# Patient Record
Sex: Female | Born: 1940 | Race: Black or African American | Hispanic: No | Smoking: Former smoker
Health system: Southern US, Community
[De-identification: ages and names within clinical notes are randomized; demographics above are authoritative.]

## PROBLEM LIST (undated history)

## (undated) DIAGNOSIS — J45909 Unspecified asthma, uncomplicated: Secondary | ICD-10-CM

## (undated) DIAGNOSIS — I639 Cerebral infarction, unspecified: Secondary | ICD-10-CM

## (undated) DIAGNOSIS — N281 Cyst of kidney, acquired: Secondary | ICD-10-CM

## (undated) DIAGNOSIS — M1990 Unspecified osteoarthritis, unspecified site: Secondary | ICD-10-CM

## (undated) DIAGNOSIS — L509 Urticaria, unspecified: Secondary | ICD-10-CM

## (undated) DIAGNOSIS — I10 Essential (primary) hypertension: Secondary | ICD-10-CM

## (undated) DIAGNOSIS — K219 Gastro-esophageal reflux disease without esophagitis: Secondary | ICD-10-CM

## (undated) DIAGNOSIS — H409 Unspecified glaucoma: Secondary | ICD-10-CM

## (undated) DIAGNOSIS — G4730 Sleep apnea, unspecified: Secondary | ICD-10-CM

## (undated) DIAGNOSIS — R569 Unspecified convulsions: Secondary | ICD-10-CM

## (undated) DIAGNOSIS — R310 Gross hematuria: Secondary | ICD-10-CM

## (undated) DIAGNOSIS — N183 Chronic kidney disease, stage 3 (moderate): Secondary | ICD-10-CM

## (undated) DIAGNOSIS — E785 Hyperlipidemia, unspecified: Secondary | ICD-10-CM

## (undated) HISTORY — PX: HAND SURGERY: SHX662

## (undated) HISTORY — DX: Essential (primary) hypertension: I10

## (undated) HISTORY — DX: Cyst of kidney, acquired: N28.1

## (undated) HISTORY — DX: Unspecified osteoarthritis, unspecified site: M19.90

## (undated) HISTORY — DX: Unspecified glaucoma: H40.9

## (undated) HISTORY — DX: Urticaria, unspecified: L50.9

## (undated) HISTORY — PX: PARTIAL HYSTERECTOMY: SHX80

## (undated) HISTORY — DX: Chronic kidney disease, stage 3 (moderate): N18.3

## (undated) HISTORY — DX: Cerebral infarction, unspecified: I63.9

## (undated) HISTORY — DX: Sleep apnea, unspecified: G47.30

## (undated) HISTORY — DX: Unspecified convulsions: R56.9

## (undated) HISTORY — DX: Gastro-esophageal reflux disease without esophagitis: K21.9

## (undated) HISTORY — DX: Gross hematuria: R31.0

## (undated) HISTORY — PX: ABDOMINAL HYSTERECTOMY: SHX81

## (undated) HISTORY — DX: Hyperlipidemia, unspecified: E78.5

---

## 2014-04-11 ENCOUNTER — Other Ambulatory Visit (HOSPITAL_COMMUNITY): Payer: Self-pay | Admitting: Family Medicine

## 2014-04-11 DIAGNOSIS — Z1231 Encounter for screening mammogram for malignant neoplasm of breast: Secondary | ICD-10-CM

## 2014-04-30 ENCOUNTER — Ambulatory Visit (HOSPITAL_COMMUNITY)
Admission: RE | Admit: 2014-04-30 | Discharge: 2014-04-30 | Disposition: A | Discharge status: Home / Self Care | Payer: Medicare HMO | Source: Ambulatory Visit | Attending: Family Medicine | Admitting: Family Medicine

## 2014-04-30 DIAGNOSIS — Z1231 Encounter for screening mammogram for malignant neoplasm of breast: Secondary | ICD-10-CM | POA: Diagnosis present

## 2014-12-14 ENCOUNTER — Other Ambulatory Visit: Payer: Self-pay | Admitting: Nephrology

## 2014-12-14 DIAGNOSIS — N183 Chronic kidney disease, stage 3 unspecified: Secondary | ICD-10-CM

## 2014-12-20 ENCOUNTER — Ambulatory Visit
Admission: RE | Admit: 2014-12-20 | Discharge: 2014-12-20 | Disposition: A | Discharge status: Home / Self Care | Payer: Medicare HMO | Source: Ambulatory Visit | Attending: Nephrology | Admitting: Nephrology

## 2014-12-20 DIAGNOSIS — N183 Chronic kidney disease, stage 3 unspecified: Secondary | ICD-10-CM

## 2014-12-20 DIAGNOSIS — N281 Cyst of kidney, acquired: Secondary | ICD-10-CM | POA: Diagnosis not present

## 2015-02-06 ENCOUNTER — Other Ambulatory Visit (HOSPITAL_COMMUNITY): Payer: Self-pay | Admitting: Respiratory Therapy

## 2015-02-06 DIAGNOSIS — G4733 Obstructive sleep apnea (adult) (pediatric): Secondary | ICD-10-CM

## 2015-02-15 ENCOUNTER — Other Ambulatory Visit (HOSPITAL_COMMUNITY): Payer: Self-pay | Admitting: Respiratory Therapy

## 2015-02-26 ENCOUNTER — Other Ambulatory Visit (HOSPITAL_COMMUNITY): Payer: Self-pay | Admitting: Nephrology

## 2015-02-26 DIAGNOSIS — IMO0002 Reserved for concepts with insufficient information to code with codable children: Secondary | ICD-10-CM

## 2015-02-26 DIAGNOSIS — R229 Localized swelling, mass and lump, unspecified: Principal | ICD-10-CM

## 2015-02-27 ENCOUNTER — Ambulatory Visit (HOSPITAL_COMMUNITY): Payer: Medicare HMO

## 2015-03-11 ENCOUNTER — Ambulatory Visit (HOSPITAL_COMMUNITY)
Admission: RE | Admit: 2015-03-11 | Discharge: 2015-03-11 | Disposition: A | Discharge status: Home / Self Care | Payer: Medicare HMO | Source: Ambulatory Visit | Attending: Nephrology | Admitting: Nephrology

## 2015-03-11 DIAGNOSIS — R319 Hematuria, unspecified: Secondary | ICD-10-CM | POA: Insufficient documentation

## 2015-03-11 DIAGNOSIS — R229 Localized swelling, mass and lump, unspecified: Secondary | ICD-10-CM

## 2015-03-11 DIAGNOSIS — K8020 Calculus of gallbladder without cholecystitis without obstruction: Secondary | ICD-10-CM | POA: Diagnosis not present

## 2015-03-11 DIAGNOSIS — K5730 Diverticulosis of large intestine without perforation or abscess without bleeding: Secondary | ICD-10-CM | POA: Diagnosis not present

## 2015-03-11 DIAGNOSIS — IMO0002 Reserved for concepts with insufficient information to code with codable children: Secondary | ICD-10-CM

## 2015-03-11 DIAGNOSIS — N2889 Other specified disorders of kidney and ureter: Secondary | ICD-10-CM | POA: Insufficient documentation

## 2015-04-09 ENCOUNTER — Ambulatory Visit: Payer: Medicare HMO | Attending: Neurology | Admitting: Sleep Medicine

## 2015-04-09 ENCOUNTER — Emergency Department (HOSPITAL_COMMUNITY): Admission: EM | Admit: 2015-04-09 | Discharge: 2015-04-09 | Discharge status: Absent without leave | Payer: Medicare HMO

## 2015-04-09 DIAGNOSIS — G4733 Obstructive sleep apnea (adult) (pediatric): Secondary | ICD-10-CM | POA: Insufficient documentation

## 2015-04-26 NOTE — Sleep Study (Signed)
  [place] A. Merlene Laughter, MD     [email or web address]             NOCTURNAL POLYSOMNOGRAPHY   LOCATION: [facility]  Patient Name: Diana Kent, Diana Kent Date: 04/09/2015 Gender: Female D.O.B: 06-Sep-1940 Age (years): 51 Referring Provider: Phillips Odor MD, ABSM Height (inches): 68 Interpreting Physician: Phillips Odor MD, ABSM Weight (lbs): 186 RPSGT: Rosebud Poles BMI: 28 MRN: [id number] Neck Size: 15.50 CLINICAL INFORMATION The patient is referred for a split night study with BPAP. MEDICATIONS Prior to Admission medications   Not on File    SLEEP STUDY TECHNIQUE As per the AASM Manual for the Scoring of Sleep and Associated Events v2.3 (April 2016) with a hypopnea requiring 4% desaturations. The channels recorded and monitored were frontal, central and occipital EEG, electrooculogram (EOG), submentalis EMG (chin), nasal and oral airflow, thoracic and abdominal wall motion, anterior tibialis EMG, snore microphone, electrocardiogram, and pulse oximetry. Bi-level positive airway pressure (BiPAP) was initiated when the patient met split night criteria and was titrated according to treat sleep-disordered breathing. RESPIRATORY PARAMETERS Diagnostic Total AHI (/hr): 61.7 RDI (/hr): 61.7 OA Index (/hr): 33.2 CA Index (/hr): 0.5 REM AHI (/hr): 47.4 NREM AHI (/hr): 64.6 Supine AHI (/hr): 58.5 Non-supine AHI (/hr): 92.19 Min O2 Sat (%): 86.00 Mean O2 (%): 93.38 Time below 88% (min): 4.6   Titration Optimal IPAP Pressure (cm): 11 Optimal EPAP Pressure (cm):  AHI at Optimal Pressure (/hr): 2.8 Min O2 at Optimal Pressure (%): 90.0 Sleep % at Optimal (%): 91 Supine % at Optimal (%): 100     SLEEP ARCHITECTURE The study was initiated at 10:24:58 PM and terminated at 4:49:10 AM. The total recorded time was 384.2 minutes. EEG confirmed total sleep time was 280.5 minutes yielding a sleep efficiency of 73.0%. Sleep onset after lights out was 27.6 minutes with a REM latency of  99.5 minutes. The patient spent 6.95% of the night in stage N1 sleep, 63.99% in stage N2 sleep, 4.28% in stage N3 and 24.78% in REM. Wake after sleep onset (WASO) was 76.1 minutes. The Arousal Index was 26.7/hour. LEG MOVEMENT DATA The total Periodic Limb Movements of Sleep (PLMS) were 138. The PLMS index was 29.52 . CARDIAC DATA The 2 lead EKG demonstrated atrial fibrillation. The mean heart rate was 49.52 beats per minute. Other EKG findings include: PVCs.   IMPRESSIONS - Severe obstructive sleep apnea occurred during the diagnostic portion of the study (AHI = 61.7 /hour). An optimal PAP pressure was selected for this patient ( 11 / cm of water).   Diana Metz, MD Diplomate, American Board of Sleep Medicine.

## 2015-05-24 ENCOUNTER — Ambulatory Visit: Payer: Self-pay | Admitting: Urology

## 2015-06-04 ENCOUNTER — Ambulatory Visit (INDEPENDENT_AMBULATORY_CARE_PROVIDER_SITE_OTHER): Payer: Medicare HMO | Admitting: Urology

## 2015-06-04 ENCOUNTER — Encounter: Payer: Self-pay | Admitting: Urology

## 2015-06-04 VITALS — BP 136/75 | HR 49 | Ht 66.5 in | Wt 186.5 lb

## 2015-06-04 DIAGNOSIS — N281 Cyst of kidney, acquired: Secondary | ICD-10-CM

## 2015-06-04 DIAGNOSIS — N183 Chronic kidney disease, stage 3 unspecified: Secondary | ICD-10-CM

## 2015-06-04 DIAGNOSIS — R310 Gross hematuria: Secondary | ICD-10-CM

## 2015-06-04 DIAGNOSIS — R3129 Other microscopic hematuria: Secondary | ICD-10-CM

## 2015-06-04 HISTORY — DX: Cyst of kidney, acquired: N28.1

## 2015-06-04 HISTORY — DX: Gross hematuria: R31.0

## 2015-06-04 HISTORY — DX: Chronic kidney disease, stage 3 unspecified: N18.30

## 2015-06-04 NOTE — Progress Notes (Signed)
06/04/2015 3:47 PM   Diana Kent 10-07-1940 [id number]  Referring provider: Chip Boer, PA-C [address] [place], [place] [address]  Chief Complaint  Patient presents with  . New Patient (Initial Visit)    hematuria, renal mass    HPI:  1 - Gross Hematuria - Pt with few episodes tea colored urine c/w gross hematuria and RBC on UA x several. Remote 20PY smoker.   2 - Bilateral Renal Cysts - Right lower posterior 2.1cm, Rt anterior 1cm, Left Lower 1.7cm cysts by [place] then non-con MRI on eval CKD. No prior contrast imaging. No solid masses.  3 - Stage 3 Renal Insufficiency - Cr 1.3's / GFR 40s by BMP x several. Established with Dr. Juleen China with [facility]. [place] and MRI 2016 w/o hydro.  PMH sig for CKD, very mild dementia ( no limitations ), HTN. NO CV disease / blood thinners. Her PCP is with [facility].   Today " Diana Kent " is seen as new patient for above.    PMH: Past Medical History  Diagnosis Date  . Acid reflux   . Arthritis   . Glaucoma   . Hyperlipidemia   . Hypertension   . Stroke ([place])   . Sleep apnea   . Seizure [facility])     Surgical History: Past Surgical History  Procedure Laterality Date  . Hand surgery    . Partial hysterectomy      Home Medications:    Medication List       This list is accurate as of: 06/04/15  3:47 PM.  Always use your most recent med list.               ALPHAGAN P 0.1 % Soln  Generic drug:  brimonidine     dipyridamole-aspirin 200-25 MG 12hr capsule  Commonly known as:  AGGRENOX     donepezil 10 MG tablet  Commonly known as:  ARICEPT     lamoTRIgine 100 MG tablet  Commonly known as:  LAMICTAL     losartan 50 MG tablet  Commonly known as:  COZAAR     lovastatin 40 MG tablet  Commonly known as:  MEVACOR     metoprolol 100 MG tablet  Commonly known as:  LOPRESSOR     verapamil 180 MG CR tablet  Commonly known as:  CALAN-SR        Allergies:  Allergies  Allergen Reactions  .  Latex   . Penicillins     Family History: Family History  Problem Relation Age of Onset  . Prostate cancer Neg Hx   . Kidney cancer Neg Hx     Social History:  has no tobacco, alcohol, and drug history on file.    Review of Systems  Gastrointestinal (upper)  : Negative for upper GI symptoms  Gastrointestinal (lower) : Negative for lower GI symptoms  Constitutional : Negative for symptoms  Skin: Negative for skin symptoms  Eyes: Negative for eye symptoms  Ear/Nose/Throat : Negative for Ear/Nose/Throat symptoms  Hematologic/Lymphatic: Negative for Hematologic/Lymphatic symptoms  Cardiovascular : Negative for cardiovascular symptoms  Respiratory : Negative for respiratory symptoms  Endocrine: Negative for endocrine symptoms  Musculoskeletal: Negative for musculoskeletal symptoms  Neurological: Negative for neurological symptoms  Psychologic: Negative for psychiatric symptoms     Physical Exam: Ht 5' 6.5" (1.689 m)  Wt 84.596 kg (186 lb 8 oz)  BMI 29.65 kg/m2  Constitutional:  Alert and oriented, No acute distress. HEENT: [place] AT, moist mucus membranes.  Trachea midline, no masses. Cardiovascular: No clubbing, cyanosis, or edema. Respiratory: Normal respiratory effort, no increased work of breathing. GI: Abdomen is soft, nontender, nondistended, no abdominal masses GU: No CVA tenderness.  Skin: No rashes, bruises or suspicious lesions. Lymph: No cervical or inguinal adenopathy. Neurologic: Grossly intact, no focal deficits, moving all 4 extremities. Psychiatric: Normal mood and affect.  Laboratory Data: No results found for: WBC, HGB, HCT, MCV, PLT  No results found for: CREATININE  No results found for: PSA  No results found for: TESTOSTERONE  No results found for: HGBA1C  Urinalysis No results found for: COLORURINE, APPEARANCEUR, LABSPEC, PHURINE, GLUCOSEU, HGBUR, BILIRUBINUR, KETONESUR, PROTEINUR, UROBILINOGEN, NITRITE,  LEUKOCYTESUR   Assessment & Plan:  1 - Gross Hematuria - rec complete eval with CT and cysto. Her GFR should be acceptable for this.   2 - Bilateral Renal Cysts - likely non-complex cysts. NO mass effect. CT as per above to maximally r/o enhancement. .  3 - Stage 3 Renal Insufficiency - likely medical renal disease as no hydro on renal imaging x several. Agree with strict BP and glucose management.   4 - RTC 3 weeks or so for Ct and cysto.    No Follow-up on file.  Alexis Frock, [facility] [address] [place], [place] [address] [phone]

## 2015-06-05 LAB — MICROSCOPIC EXAMINATION

## 2015-06-05 LAB — URINALYSIS, COMPLETE
Bilirubin, UA: NEGATIVE
Glucose, UA: NEGATIVE
Ketones, UA: NEGATIVE
Leukocytes, UA: NEGATIVE
NITRITE UA: NEGATIVE
PH UA: 7 (ref 5.0–7.5)
Protein, UA: NEGATIVE
Specific Gravity, UA: 1.025 (ref 1.005–1.030)
Urobilinogen, Ur: 0.2 mg/dL (ref 0.2–1.0)

## 2015-06-25 ENCOUNTER — Other Ambulatory Visit: Payer: Medicare HMO

## 2015-06-27 ENCOUNTER — Ambulatory Visit
Admission: RE | Admit: 2015-06-27 | Discharge: 2015-06-27 | Disposition: A | Discharge status: Home / Self Care | Payer: Medicare HMO | Source: Ambulatory Visit | Attending: Urology | Admitting: Urology

## 2015-06-27 DIAGNOSIS — K8020 Calculus of gallbladder without cholecystitis without obstruction: Secondary | ICD-10-CM | POA: Insufficient documentation

## 2015-06-27 DIAGNOSIS — K5730 Diverticulosis of large intestine without perforation or abscess without bleeding: Secondary | ICD-10-CM | POA: Insufficient documentation

## 2015-06-27 DIAGNOSIS — N281 Cyst of kidney, acquired: Secondary | ICD-10-CM | POA: Diagnosis present

## 2015-06-27 DIAGNOSIS — R310 Gross hematuria: Secondary | ICD-10-CM | POA: Diagnosis not present

## 2015-06-27 DIAGNOSIS — N289 Disorder of kidney and ureter, unspecified: Secondary | ICD-10-CM | POA: Insufficient documentation

## 2015-06-27 HISTORY — DX: Unspecified asthma, uncomplicated: J45.909

## 2015-06-27 LAB — POCT I-STAT CREATININE: CREATININE: 1.4 mg/dL — AB (ref 0.44–1.00)

## 2015-06-27 MED ORDER — IOHEXOL 300 MG/ML  SOLN
100.0000 mL | Freq: Once | INTRAMUSCULAR | Status: AC | PRN
  Administered 2015-06-27: 100 mL via INTRAVENOUS

## 2015-07-08 ENCOUNTER — Ambulatory Visit (INDEPENDENT_AMBULATORY_CARE_PROVIDER_SITE_OTHER): Payer: Medicare HMO | Admitting: Urology

## 2015-07-08 ENCOUNTER — Encounter: Payer: Self-pay | Admitting: Urology

## 2015-07-08 VITALS — BP 170/96 | HR 58 | Ht 66.0 in | Wt 192.7 lb

## 2015-07-08 DIAGNOSIS — N281 Cyst of kidney, acquired: Secondary | ICD-10-CM | POA: Diagnosis not present

## 2015-07-08 DIAGNOSIS — R310 Gross hematuria: Secondary | ICD-10-CM | POA: Diagnosis not present

## 2015-07-08 DIAGNOSIS — N183 Chronic kidney disease, stage 3 unspecified: Secondary | ICD-10-CM

## 2015-07-08 LAB — URINALYSIS, COMPLETE
BILIRUBIN UA: NEGATIVE
GLUCOSE, UA: NEGATIVE
Ketones, UA: NEGATIVE
Nitrite, UA: NEGATIVE
Specific Gravity, UA: 1.015 (ref 1.005–1.030)
UUROB: 0.2 mg/dL (ref 0.2–1.0)
pH, UA: 7 (ref 5.0–7.5)

## 2015-07-08 LAB — MICROSCOPIC EXAMINATION

## 2015-07-08 MED ORDER — CIPROFLOXACIN HCL 500 MG PO TABS
500.0000 mg | ORAL_TABLET | Freq: Once | ORAL | Status: AC
  Administered 2015-07-08: 500 mg via ORAL

## 2015-07-08 MED ORDER — LIDOCAINE HCL 2 % EX GEL
1.0000 "application " | Freq: Once | CUTANEOUS | Status: AC
  Administered 2015-07-08: 1 via URETHRAL

## 2015-07-08 NOTE — Progress Notes (Signed)
11:28 AM   Diana Kent September 01, 1940 [id number]  Referring provider: Chip Boer, PA-C [address] [place], [place] [address]  Chief Complaint  Patient presents with  . Cysto    HPI:  1 - Gross Hematuria - Pt with few episodes tea colored urine c/w gross hematuria and RBC on UA x several. Remote 20PY smoker. CT Urogram 06/2015 w/o worisome lesions.   2 - Bilateral Renal Cysts - Right lower posterior 2.1cm, Rt anterior 1cm, Left Lower 1.7cm cysts by [place] then non-con MRI on eval CKD. Contrast CT 06/2015 w/o enhancement or interval growth,  No solid masses.  3 - Stage 3 Renal Insufficiency - Cr 1.3's / GFR 40s by BMP x several. Established with Dr. Juleen China with [facility]. [place] and MRI 2016 w/o hydro.  PMH sig for CKD, very mild dementia ( no limitations ), HTN. NO CV disease / blood thinners. Her PCP is with [facility].   Today " Diana Kent " is seen for f./u above and cysto to complete hematuria eval.  No interval hematuria.    PMH: Past Medical History  Diagnosis Date  . Acid reflux   . Arthritis   . Glaucoma   . Hyperlipidemia   . Hypertension   . Stroke ([place])   . Sleep apnea   . Seizure ([place])   . Asthma   . CKD (chronic kidney disease) stage 3, GFR 30-59 ml/min 06/04/2015  . Gross hematuria 06/04/2015  . Renal cysts, acquired, bilateral 06/04/2015    Surgical History: Past Surgical History  Procedure Laterality Date  . Hand surgery    . Partial hysterectomy      Home Medications:    Medication List       This list is accurate as of: 07/08/15 11:28 AM.  Always use your most recent med list.               ALPHAGAN P 0.1 % Soln  Generic drug:  brimonidine     dipyridamole-aspirin 200-25 MG 12hr capsule  Commonly known as:  AGGRENOX     donepezil 10 MG tablet  Commonly known as:  ARICEPT     lamoTRIgine 100 MG tablet  Commonly known as:  LAMICTAL     losartan 50 MG tablet  Commonly known as:  COZAAR     lovastatin 40 MG  tablet  Commonly known as:  MEVACOR     metoprolol 100 MG tablet  Commonly known as:  LOPRESSOR     verapamil 180 MG CR tablet  Commonly known as:  CALAN-SR        Allergies:  Allergies  Allergen Reactions  . Latex   . Penicillins     Family History: Family History  Problem Relation Age of Onset  . Prostate cancer Neg Hx   . Kidney cancer Neg Hx     Social History:  reports that she quit smoking about 25 years ago. She does not have any smokeless tobacco history on file. She reports that she does not drink alcohol or use illicit drugs.    Review of Systems  Gastrointestinal (upper)  : Negative for upper GI symptoms  Gastrointestinal (lower) : Negative for lower GI symptoms  Constitutional : Negative for symptoms  Skin: Negative for skin symptoms  Eyes: Negative for eye symptoms  Ear/Nose/Throat : Negative for Ear/Nose/Throat symptoms  Hematologic/Lymphatic: Negative for Hematologic/Lymphatic symptoms  Cardiovascular : Negative for cardiovascular symptoms  Respiratory : Negative for respiratory symptoms  Endocrine: Negative  for endocrine symptoms  Musculoskeletal: Negative for musculoskeletal symptoms  Neurological: Negative for neurological symptoms  Psychologic: Negative for psychiatric symptoms  Neurological Headaches?: No Dizziness?: Yes  Physical Exam: BP 170/96 mmHg  Pulse 58  Ht 5\' 6"  (1.676 m)  Wt 192 lb 11.2 oz (87.408 kg)  BMI 31.12 kg/m2  Constitutional:  Alert and oriented, No acute distress. HEENT: [place] AT, moist mucus membranes.  Trachea midline, no masses. Cardiovascular: No clubbing, cyanosis, or edema. Respiratory: Normal respiratory effort, no increased work of breathing. GI: Abdomen is soft, nontender, nondistended, no abdominal masses GU: No CVA tenderness.  Skin: No rashes, bruises or suspicious lesions. Lymph: No cervical or inguinal adenopathy. Neurologic: Grossly intact, no focal deficits, moving all 4  extremities. Psychiatric: Normal mood and affect.  Laboratory Data: No results found for: WBC, HGB, HCT, MCV, PLT  Lab Results  Component Value Date   CREATININE 1.40* 06/27/2015    No results found for: PSA  No results found for: TESTOSTERONE  No results found for: HGBA1C  Urinalysis    Component Value Date/Time   GLUCOSEU Negative 06/04/2015 1540   BILIRUBINUR Negative 06/04/2015 1540   NITRITE Negative 06/04/2015 1540   LEUKOCYTESUR Negative 06/04/2015 1540      Cystoscopy Procedure Note  Patient identification was confirmed, informed consent was obtained, and patient was prepped using Betadine solution.  Lidocaine jelly was administered per urethral meatus.    Preoperative abx where received prior to procedure.    Procedure: - Flexible cystoscope introduced, without any difficulty.  Mild cystocele noted (grade 2). - Thorough search of the bladder revealed:    normal urethral meatus    normal urothelium    no stones    no ulcers     no tumors    no urethral polyps    no trabeculation  - Ureteral orifices were normal in position and appearance.  Post-Procedure: - Patient tolerated the procedure well    Assessment & Plan:  1 - Gross Hematuria - eval with labs, imaging, exam, cysto w/o worrisome etiology. Discussed need for repeat eval for future unexplained gross episodes onliy. Pt and daughter voiced understanding.   2 - Bilateral Renal Cysts - Non-complex by contrast axial imaging. No furhter eval or surveillance warranted.   3 - Stage 3 Renal Insufficiency - likely medical renal disease as no hydro on renal imaging x several. Agree with strict BP and glucose management.   4 - RTC 1 year to verify no interval hematuria, and then prn if none.    No Follow-up on file.  Alexis Frock, [facility] [address] [place], [place] [address] [phone]

## 2015-07-08 NOTE — Addendum Note (Signed)
Addended by: Wilson Singer on: 07/08/2015 03:06 PM   Modules accepted: Orders

## 2015-07-22 ENCOUNTER — Other Ambulatory Visit (HOSPITAL_COMMUNITY): Payer: Self-pay | Admitting: Physician Assistant

## 2015-07-22 DIAGNOSIS — Z1231 Encounter for screening mammogram for malignant neoplasm of breast: Secondary | ICD-10-CM

## 2015-07-31 ENCOUNTER — Ambulatory Visit (HOSPITAL_COMMUNITY)
Admission: RE | Admit: 2015-07-31 | Discharge: 2015-07-31 | Disposition: A | Discharge status: Home / Self Care | Payer: Medicare HMO | Source: Ambulatory Visit | Attending: Physician Assistant | Admitting: Physician Assistant

## 2015-07-31 DIAGNOSIS — Z1231 Encounter for screening mammogram for malignant neoplasm of breast: Secondary | ICD-10-CM | POA: Insufficient documentation

## 2016-01-29 ENCOUNTER — Encounter (INDEPENDENT_AMBULATORY_CARE_PROVIDER_SITE_OTHER): Payer: Self-pay | Admitting: *Deleted

## 2016-07-07 ENCOUNTER — Ambulatory Visit (INDEPENDENT_AMBULATORY_CARE_PROVIDER_SITE_OTHER): Payer: Medicare HMO | Admitting: Urology

## 2016-07-07 VITALS — BP 149/84 | HR 54 | Ht 67.0 in | Wt 185.2 lb

## 2016-07-07 DIAGNOSIS — N281 Cyst of kidney, acquired: Secondary | ICD-10-CM

## 2016-07-07 DIAGNOSIS — R310 Gross hematuria: Secondary | ICD-10-CM

## 2016-07-07 DIAGNOSIS — N183 Chronic kidney disease, stage 3 unspecified: Secondary | ICD-10-CM

## 2016-07-07 NOTE — Progress Notes (Signed)
6:45 AM   Diana Kent 01-28-1941 [id number]  Referring provider: Chip Boer, PA-C [address] [place], [place] [address]  No chief complaint on file.   HPI:  1 - Gross Hematuria - Pt with few episodes tea colored urine c/w gross hematuria and RBC on UA x several. Remote 20PY smoker. CT Urogram 06/2015 w/o worisome lesions. Cysto 06/2015 unremarkable.   2 - Bilateral Renal Cysts - Right lower posterior 2.1cm, Rt anterior 1cm, Left Lower 1.7cm cysts by [place] then non-con MRI on eval CKD. Contrast CT 06/2015 w/o enhancement or interval growth,  No solid masses.  3 - Stage 3 Renal Insufficiency - Cr 1.3's / GFR 40s by BMP x several. Established with Dr. Juleen China with [facility]. [place] and MRI 2016 w/o hydro.  PMH sig for CKD, very mild dementia ( no limitations ), HTN. NO CV disease / blood thinners. Her PCP is with [facility].   Today " Diana Kent " is seen for f./u above.  No interval hematuria.    PMH: Past Medical History:  Diagnosis Date  . Acid reflux   . Arthritis   . Asthma   . CKD (chronic kidney disease) stage 3, GFR 30-59 ml/min 06/04/2015  . Glaucoma   . Gross hematuria 06/04/2015  . Hyperlipidemia   . Hypertension   . Renal cysts, acquired, bilateral 06/04/2015  . Seizure ([place])   . Sleep apnea   . Stroke [facility])     Surgical History: Past Surgical History:  Procedure Laterality Date  . HAND SURGERY    . PARTIAL HYSTERECTOMY      Home Medications:  Allergies as of 07/07/2016      Reactions   Latex    Penicillins       Medication List       Accurate as of 07/07/16  6:45 AM. Always use your most recent med list.          ALPHAGAN P 0.1 % Soln Generic drug:  brimonidine   dipyridamole-aspirin 200-25 MG 12hr capsule Commonly known as:  AGGRENOX   donepezil 10 MG tablet Commonly known as:  ARICEPT   lamoTRIgine 100 MG tablet Commonly known as:  LAMICTAL   losartan 50 MG tablet Commonly known as:  COZAAR   lovastatin 40  MG tablet Commonly known as:  MEVACOR   metoprolol 100 MG tablet Commonly known as:  LOPRESSOR   verapamil 180 MG CR tablet Commonly known as:  CALAN-SR       Allergies:  Allergies  Allergen Reactions  . Latex   . Penicillins     Family History: Family History  Problem Relation Age of Onset  . Prostate cancer Neg Hx   . Kidney cancer Neg Hx     Social History:  reports that she quit smoking about 26 years ago. She does not have any smokeless tobacco history on file. She reports that she does not drink alcohol or use drugs.    Review of Systems  Gastrointestinal (upper)  : Negative for upper GI symptoms  Gastrointestinal (lower) : Negative for lower GI symptoms  Constitutional : Negative for symptoms  Skin: Negative for skin symptoms  Eyes: Negative for eye symptoms  Ear/Nose/Throat : Negative for Ear/Nose/Throat symptoms  Hematologic/Lymphatic: Negative for Hematologic/Lymphatic symptoms  Cardiovascular : Negative for cardiovascular symptoms  Respiratory : Negative for respiratory symptoms  Endocrine: Negative for endocrine symptoms  Musculoskeletal: Negative for musculoskeletal symptoms  Neurological: Negative for neurological symptoms  Psychologic: Negative for  psychiatric symptoms     Physical Exam: There were no vitals taken for this visit.  Constitutional:  Alert and oriented, No acute distress. HEENT: [place] AT, moist mucus membranes.  Trachea midline, no masses. Cardiovascular: No clubbing, cyanosis, or edema. Respiratory: Normal respiratory effort, no increased work of breathing. GI: Abdomen is soft, nontender, nondistended, no abdominal masses GU: No CVA tenderness.  Skin: No rashes, bruises or suspicious lesions. Lymph: No cervical or inguinal adenopathy. Neurologic: Grossly intact, no focal deficits, moving all 4 extremities. Psychiatric: Normal mood and affect.  Urinalysis   Assessment & Plan:  1 - Gross Hematuria -  eval with labs, imaging, exam, cysto w/o worrisome etiology. This has been non-recurrent. Discussed need for repeat eval for future unexplained gross episodes onliy. Pt and son voiced understanding.   2 - Bilateral Renal Cysts - Non-complex by contrast axial imaging. No furhter eval or surveillance warranted.   3 - Stage 3 Renal Insufficiency - likely medical renal disease as no hydro on renal imaging x several. Agree with strict BP and glucose management.   4 - RTC PRN  Alexis Frock, [place] [address] [place], [place] [address] [phone]

## 2016-07-09 ENCOUNTER — Ambulatory Visit: Payer: Medicare HMO

## 2016-11-13 ENCOUNTER — Other Ambulatory Visit (HOSPITAL_COMMUNITY): Payer: Self-pay | Admitting: Physician Assistant

## 2016-11-13 DIAGNOSIS — Z1231 Encounter for screening mammogram for malignant neoplasm of breast: Secondary | ICD-10-CM

## 2016-11-20 ENCOUNTER — Encounter (INDEPENDENT_AMBULATORY_CARE_PROVIDER_SITE_OTHER): Payer: Self-pay | Admitting: *Deleted

## 2016-11-25 ENCOUNTER — Ambulatory Visit (HOSPITAL_COMMUNITY)
Admission: RE | Admit: 2016-11-25 | Discharge: 2016-11-25 | Disposition: A | Discharge status: Home / Self Care | Payer: Medicare HMO | Source: Ambulatory Visit | Attending: Physician Assistant | Admitting: Physician Assistant

## 2016-11-25 DIAGNOSIS — Z1231 Encounter for screening mammogram for malignant neoplasm of breast: Secondary | ICD-10-CM | POA: Diagnosis not present

## 2017-08-04 ENCOUNTER — Encounter: Payer: Self-pay | Admitting: Unknown Physician Specialty

## 2017-08-04 ENCOUNTER — Ambulatory Visit: Payer: Medicare HMO | Admitting: Anesthesiology

## 2017-08-04 ENCOUNTER — Encounter
Admission: RE | Disposition: A | Discharge status: Home / Self Care | Payer: Self-pay | Source: Ambulatory Visit | Attending: Unknown Physician Specialty

## 2017-08-04 ENCOUNTER — Ambulatory Visit
Admission: RE | Admit: 2017-08-04 | Discharge: 2017-08-04 | Disposition: A | Discharge status: Home / Self Care | Payer: Medicare HMO | Source: Ambulatory Visit | Attending: Unknown Physician Specialty | Admitting: Unknown Physician Specialty

## 2017-08-04 DIAGNOSIS — D123 Benign neoplasm of transverse colon: Secondary | ICD-10-CM | POA: Insufficient documentation

## 2017-08-04 DIAGNOSIS — Z87891 Personal history of nicotine dependence: Secondary | ICD-10-CM | POA: Diagnosis not present

## 2017-08-04 DIAGNOSIS — G4730 Sleep apnea, unspecified: Secondary | ICD-10-CM | POA: Insufficient documentation

## 2017-08-04 DIAGNOSIS — H409 Unspecified glaucoma: Secondary | ICD-10-CM | POA: Diagnosis not present

## 2017-08-04 DIAGNOSIS — Z79899 Other long term (current) drug therapy: Secondary | ICD-10-CM | POA: Insufficient documentation

## 2017-08-04 DIAGNOSIS — Z8673 Personal history of transient ischemic attack (TIA), and cerebral infarction without residual deficits: Secondary | ICD-10-CM | POA: Insufficient documentation

## 2017-08-04 DIAGNOSIS — Z79890 Hormone replacement therapy (postmenopausal): Secondary | ICD-10-CM | POA: Diagnosis not present

## 2017-08-04 DIAGNOSIS — I129 Hypertensive chronic kidney disease with stage 1 through stage 4 chronic kidney disease, or unspecified chronic kidney disease: Secondary | ICD-10-CM | POA: Insufficient documentation

## 2017-08-04 DIAGNOSIS — Z86010 Personal history of colonic polyps: Secondary | ICD-10-CM | POA: Diagnosis not present

## 2017-08-04 DIAGNOSIS — Z1211 Encounter for screening for malignant neoplasm of colon: Secondary | ICD-10-CM | POA: Insufficient documentation

## 2017-08-04 DIAGNOSIS — N183 Chronic kidney disease, stage 3 (moderate): Secondary | ICD-10-CM | POA: Diagnosis not present

## 2017-08-04 DIAGNOSIS — D122 Benign neoplasm of ascending colon: Secondary | ICD-10-CM | POA: Insufficient documentation

## 2017-08-04 DIAGNOSIS — E785 Hyperlipidemia, unspecified: Secondary | ICD-10-CM | POA: Insufficient documentation

## 2017-08-04 HISTORY — PX: COLONOSCOPY WITH PROPOFOL: SHX5780

## 2017-08-04 SURGERY — COLONOSCOPY WITH PROPOFOL
Anesthesia: General

## 2017-08-04 MED ORDER — LIDOCAINE HCL (PF) 1 % IJ SOLN
2.0000 mL | Freq: Once | INTRAMUSCULAR | Status: AC
  Administered 2017-08-04: 60 mg via INTRADERMAL

## 2017-08-04 MED ORDER — GLYCOPYRROLATE 0.2 MG/ML IJ SOLN
INTRAMUSCULAR | Status: DC | PRN
  Administered 2017-08-04: .2 mg via INTRAVENOUS

## 2017-08-04 MED ORDER — SODIUM CHLORIDE 0.9 % IV SOLN
INTRAVENOUS | Status: DC
  Administered 2017-08-04 (×2): via INTRAVENOUS

## 2017-08-04 MED ORDER — FENTANYL CITRATE (PF) 100 MCG/2ML IJ SOLN
INTRAMUSCULAR | Status: AC
  Filled 2017-08-04: qty 2

## 2017-08-04 MED ORDER — PROPOFOL 10 MG/ML IV BOLUS
INTRAVENOUS | Status: DC | PRN
  Administered 2017-08-04 (×2): 30 mg via INTRAVENOUS

## 2017-08-04 MED ORDER — EPHEDRINE SULFATE 50 MG/ML IJ SOLN
INTRAMUSCULAR | Status: DC | PRN
  Administered 2017-08-04: 10 mg via INTRAVENOUS

## 2017-08-04 MED ORDER — LIDOCAINE HCL (PF) 1 % IJ SOLN
INTRAMUSCULAR | Status: AC
  Filled 2017-08-04: qty 2

## 2017-08-04 MED ORDER — PROPOFOL 500 MG/50ML IV EMUL
INTRAVENOUS | Status: DC | PRN
  Administered 2017-08-04: 150 ug/kg/min via INTRAVENOUS

## 2017-08-04 MED ORDER — FENTANYL CITRATE (PF) 100 MCG/2ML IJ SOLN
INTRAMUSCULAR | Status: DC | PRN
  Administered 2017-08-04: 50 ug via INTRAVENOUS

## 2017-08-04 MED ORDER — LIDOCAINE HCL (PF) 2 % IJ SOLN
INTRAMUSCULAR | Status: AC
  Filled 2017-08-04: qty 10

## 2017-08-04 MED ORDER — PROPOFOL 10 MG/ML IV BOLUS
INTRAVENOUS | Status: AC
  Filled 2017-08-04: qty 20

## 2017-08-04 MED ORDER — EPHEDRINE SULFATE 50 MG/ML IJ SOLN
INTRAMUSCULAR | Status: AC
  Filled 2017-08-04: qty 1

## 2017-08-04 NOTE — Op Note (Signed)
[facility] Gastroenterology Patient Name: Diana Kent Procedure Date: 08/04/2017 9:38 AM MRN: [id number] Account #: [id number] Date of Birth: 17-Jan-1941 Admit Type: Outpatient Age: 77 Room: [facility] ENDO ROOM 3 Gender: Female Note Status: Finalized Procedure:            Colonoscopy Indications:          High risk colon cancer surveillance: Personal history                        of colonic polyps Providers:            Manya Silvas, MD Referring MD:         No Local Md, MD (Referring MD) Medicines:            Propofol per Anesthesia Complications:        No immediate complications. Procedure:            Pre-Anesthesia Assessment:                       - After reviewing the risks and benefits, the patient                        was deemed in satisfactory condition to undergo the                        procedure.                       After obtaining informed consent, the colonoscope was                        passed under direct vision. Throughout the procedure,                        the patient's blood pressure, pulse, and oxygen                        saturations were monitored continuously. The                        Colonoscope was introduced through the anus and                        advanced to the the cecum, identified by appendiceal                        orifice and ileocecal valve. The colonoscopy was                        somewhat difficult due to a tortuous colon. Successful                        completion of the procedure was aided by applying                        abdominal pressure. The patient tolerated the procedure                        well. The quality of the bowel preparation was  excellent. Findings:      Two sessile polyps were found in the hepatic flexure. The polyps were       diminutive in size. These polyps were removed with a cold snare.       Resection and retrieval were complete.      A small polyp  was found in the hepatic flexure. The polyp was sessile.       The polyp was removed with a hot snare. Resection and retrieval were       complete.      A small polyp was found in the ascending colon. The polyp was sessile.       The polyp was removed with a hot snare. Resection and retrieval were       complete.      A diminutive polyp was found in the hepatic flexure. The polyp was       sessile. The polyp was removed with a jumbo cold forceps. Resection and       retrieval were complete. Impression:           - Two diminutive polyps at the hepatic flexure, removed                        with a cold snare. Resected and retrieved.                       - One small polyp at the hepatic flexure, removed with                        a hot snare. Resected and retrieved.                       - One small polyp in the ascending colon, removed with                        a hot snare. Resected and retrieved.                       - One diminutive polyp at the hepatic flexure, removed                        with a jumbo cold forceps. Resected and retrieved. Recommendation:       - Await pathology results. Manya Silvas, MD 08/04/2017 10:50:07 AM This report has been signed electronically. Number of Addenda: 0 Note Initiated On: 08/04/2017 9:38 AM Scope Withdrawal Time: 0 hours 11 minutes 26 seconds  Total Procedure Duration: 0 hours 29 minutes 26 seconds       [facility]

## 2017-08-04 NOTE — Anesthesia Postprocedure Evaluation (Signed)
Anesthesia Post Note  Patient: Diana Kent  Procedure(s) Performed: COLONOSCOPY WITH PROPOFOL (N/A )  Patient location during evaluation: Endoscopy Anesthesia Type: General Level of consciousness: awake and alert Pain management: pain level controlled Vital Signs Assessment: post-procedure vital signs reviewed and stable Respiratory status: spontaneous breathing, nonlabored ventilation, respiratory function stable and patient connected to nasal cannula oxygen Cardiovascular status: blood pressure returned to baseline and stable Postop Assessment: no apparent nausea or vomiting Anesthetic complications: no     Last Vitals:  Vitals:   08/04/17 1110 08/04/17 1120  BP: 134/87 129/87  Pulse:  71  Resp: 16 16  Temp:    SpO2: 100% 100%    Last Pain:  Vitals:   08/04/17 1050  TempSrc: Tympanic                 Haidyn Kilburg Harvie Heck

## 2017-08-04 NOTE — Transfer of Care (Signed)
Immediate Anesthesia Transfer of Care Note  Patient: Rogenia Werntz  Procedure(s) Performed: COLONOSCOPY WITH PROPOFOL (N/A )  Patient Location: PACU  Anesthesia Type:General  Level of Consciousness: sedated  Airway & Oxygen Therapy: Patient Spontanous Breathing and Patient connected to nasal cannula oxygen  Post-op Assessment: Report given to RN and Post -op Vital signs reviewed and stable  Post vital signs: Reviewed and stable  Last Vitals:  Vitals:   08/04/17 0942 08/04/17 1050  BP: (!) 180/100 110/70  Pulse: 65 67  Resp: 17 19  Temp: (!) 35.2 C (!) 35.6 C  SpO2: 100% 100%    Last Pain:  Vitals:   08/04/17 1050  TempSrc: Tympanic         Complications: No apparent anesthesia complications

## 2017-08-04 NOTE — Anesthesia Post-op Follow-up Note (Signed)
Anesthesia QCDR form completed.        

## 2017-08-04 NOTE — H&P (Signed)
Primary Care Physician:  Alfonse Flavors, MD Primary Gastroenterologist:  Dr. Vira Agar  Pre-Procedure History & Physical: HPI:  Diana Kent is a 77 y.o. female is here for an colonoscopy.  Personal history of colon polyps   Past Medical History:  Diagnosis Date  . Acid reflux   . Arthritis   . Asthma   . CKD (chronic kidney disease) stage 3, GFR 30-59 ml/min (HCC) 06/04/2015  . Glaucoma   . Gross hematuria 06/04/2015  . Hyperlipidemia   . Hypertension   . Renal cysts, acquired, bilateral 06/04/2015  . Seizure ([place])   . Sleep apnea   . Stroke [facility])      Past Surgical History:  Procedure Laterality Date  . HAND SURGERY    . PARTIAL HYSTERECTOMY    Personal history of colon polyps.  Prior to Admission medications   Medication Sig Start Date End Date Taking? Authorizing Provider  ALPHAGAN P 0.1 % SOLN  04/03/15   [provider]  dipyridamole-aspirin (AGGRENOX) 200-25 MG 12hr capsule  05/29/15   [provider]  donepezil (ARICEPT) 10 MG tablet  05/11/15   [provider]  lamoTRIgine (LAMICTAL) 100 MG tablet  05/11/15   [provider]  levothyroxine (SYNTHROID, LEVOTHROID) 75 MCG tablet  04/29/16   [provider]  losartan (COZAAR) 50 MG tablet  04/23/15   [provider]  lovastatin (MEVACOR) 40 MG tablet  03/11/15   [provider]  metoprolol (LOPRESSOR) 100 MG tablet  05/11/15   [provider]  verapamil (CALAN-SR) 180 MG CR tablet  03/11/15   [provider]    Allergies as of 06/03/2017 - Review Complete 07/07/2016  Allergen Reaction Noted  . Latex  06/04/2015  . Penicillins  06/04/2015    Family History  Problem Relation Age of Onset  . Prostate cancer Neg Hx   . Kidney cancer Neg Hx     Social History   Socioeconomic History  . Marital status: Widowed    Spouse name: Not on file  . Number of children: Not on file  . Years of education: Not on file  . Highest  education level: Not on file  Social Needs  . Financial resource strain: Not on file  . Food insecurity - worry: Not on file  . Food insecurity - inability: Not on file  . Transportation needs - medical: Not on file  . Transportation needs - non-medical: Not on file  Occupational History  . Not on file  Tobacco Use  . Smoking status: Former Smoker    Last attempt to quit: 06/03/1990    Years since quitting: 27.1  Substance and Sexual Activity  . Alcohol use: No    Alcohol/week: 0.0 oz  . Drug use: No  . Sexual activity: Not on file  Other Topics Concern  . Not on file  Social History Narrative  . Not on file    Review of Systems: See HPI, otherwise negative ROS  Physical Exam: BP 129/87   Pulse 71   Temp (!) 96 F (35.6 C) (Tympanic)   Resp 16   Ht 5\' 6"  (1.676 m)   Wt 85.3 kg (188 lb)   SpO2 100%   BMI 30.34 kg/m  General:   Alert,  pleasant and cooperative in NAD Head:  Normocephalic and atraumatic. Neck:  Supple; no masses or thyromegaly. Lungs:  Clear throughout to auscultation.    Heart:  Regular rate and rhythm. Abdomen:  Soft, nontender and nondistended. Normal  bowel sounds, without guarding, and without rebound.   Neurologic:  Alert and  oriented x4;  grossly normal neurologically.  Impression/Plan: Diana Kent is here for an colonoscopy to be performed for [facility] colon polyps  Risks, benefits, limitations, and alternatives regarding  colonoscopy have been reviewed with the patient.  Questions have been answered.  All parties agreeable.   Gaylyn Cheers, MD  08/04/2017, 2:39 PM

## 2017-08-04 NOTE — Anesthesia Preprocedure Evaluation (Signed)
Anesthesia Evaluation  Patient identified by MRN, date of birth, ID band Patient awake    Reviewed: Allergy & Precautions, H&P , NPO status , reviewed documented beta blocker date and time   Airway Mallampati: II  TM Distance: >3 FB     Dental  (+) Upper Dentures, Partial Lower   Pulmonary    Pulmonary exam normal        Cardiovascular hypertension, Normal cardiovascular exam     Neuro/Psych    GI/Hepatic GERD  ,  Endo/Other    Renal/GU      Musculoskeletal   Abdominal   Peds  Hematology   Anesthesia Other Findings   Reproductive/Obstetrics                             Anesthesia Physical Anesthesia Plan  ASA: II  Anesthesia Plan: General   Post-op Pain Management:    Induction:   PONV Risk Score and Plan: 3 and Propofol infusion  Airway Management Planned:   Additional Equipment:   Intra-op Plan:   Post-operative Plan:   Informed Consent: I have reviewed the patients History and Physical, chart, labs and discussed the procedure including the risks, benefits and alternatives for the proposed anesthesia with the patient or authorized representative who has indicated his/her understanding and acceptance.   Dental Advisory Given  Plan Discussed with: CRNA  Anesthesia Plan Comments:         Anesthesia Quick Evaluation

## 2017-08-04 NOTE — Anesthesia Procedure Notes (Signed)
Date/Time: 08/04/2017 10:12 AM Performed by: Allean Found, CRNA Pre-anesthesia Checklist: Patient identified, Emergency Drugs available, Suction available, Patient being monitored and Timeout performed Patient Re-evaluated:Patient Re-evaluated prior to induction Oxygen Delivery Method: Nasal cannula Placement Confirmation: positive ETCO2

## 2017-08-05 ENCOUNTER — Encounter: Payer: Self-pay | Admitting: Unknown Physician Specialty

## 2017-08-05 LAB — SURGICAL PATHOLOGY

## 2017-09-20 ENCOUNTER — Encounter: Payer: Self-pay | Admitting: Cardiology

## 2017-09-20 ENCOUNTER — Ambulatory Visit (INDEPENDENT_AMBULATORY_CARE_PROVIDER_SITE_OTHER): Payer: Medicare HMO | Admitting: Cardiology

## 2017-09-20 VITALS — BP 136/80 | HR 82 | Ht 66.0 in | Wt 187.0 lb

## 2017-09-20 DIAGNOSIS — R079 Chest pain, unspecified: Secondary | ICD-10-CM | POA: Diagnosis not present

## 2017-09-20 DIAGNOSIS — R0789 Other chest pain: Secondary | ICD-10-CM

## 2017-09-20 NOTE — Patient Instructions (Signed)
Medication Instructions:  Your physician recommends that you continue on your current medications as directed. Please refer to the Current Medication list given to you today.   Labwork: NONE  Testing/Procedures: Dobutamine Nuclear Stress Test   Follow-Up: Your physician recommends that you schedule a follow-up appointment in: pending test results    Any Other Special Instructions Will Be Listed Below (If Applicable).     If you need a refill on your cardiac medications before your next appointment, please call your pharmacy.

## 2017-09-20 NOTE — Progress Notes (Signed)
Clinical Summary Diana Kent is a 77 y.o.female seen as new consult, referred for chest pain by Dr Angelia Mould.   1. Chest pain - started about 1 month ago - dull pain left sided, can occur at anythime. 3/10 in severity. Can feel hot. Pain lasts about 1-2 minutes. Occurs about 2 times a week - notes some fatigue with her housework. Some DOE with walking her stairs at home, which is new for her.   CAD risk factors: HTN, hyperlipidemia, former smoker x 5 years, prior CVA     Past Medical History:  Diagnosis Date  . Acid reflux   . Arthritis   . Asthma   . CKD (chronic kidney disease) stage 3, GFR 30-59 ml/min (HCC) 06/04/2015  . Glaucoma   . Gross hematuria 06/04/2015  . Hyperlipidemia   . Hypertension   . Renal cysts, acquired, bilateral 06/04/2015  . Seizure ([place])   . Sleep apnea   . Stroke [facility])      Allergies  Allergen Reactions  . Latex   . Penicillins      Current Outpatient Medications  Medication Sig Dispense Refill  . ALPHAGAN P 0.1 % SOLN     . dipyridamole-aspirin (AGGRENOX) 200-25 MG 12hr capsule     . donepezil (ARICEPT) 10 MG tablet     . lamoTRIgine (LAMICTAL) 100 MG tablet     . levothyroxine (SYNTHROID, LEVOTHROID) 75 MCG tablet     . losartan (COZAAR) 50 MG tablet     . lovastatin (MEVACOR) 40 MG tablet     . metoprolol (LOPRESSOR) 100 MG tablet     . verapamil (CALAN-SR) 180 MG CR tablet      No current facility-administered medications for this visit.      Past Surgical History:  Procedure Laterality Date  . COLONOSCOPY WITH PROPOFOL N/A 08/04/2017   Procedure: COLONOSCOPY WITH PROPOFOL;  Surgeon: Manya Silvas, MD;  Location[facility] ENDOSCOPY;  Service: Endoscopy;  Laterality: N/A;  . HAND SURGERY    . PARTIAL HYSTERECTOMY       Allergies  Allergen Reactions  . Latex   . Penicillins       Family History  Problem Relation Age of Onset  . Prostate cancer Neg Hx   . Kidney cancer Neg Hx      Social History Ms.  Kent reports that she quit smoking about 27 years ago. She does not have any smokeless tobacco history on file. Diana Kent reports that she does not drink alcohol.   Review of Systems CONSTITUTIONAL: No weight loss, fever, chills, weakness or fatigue.  HEENT: Eyes: No visual loss, blurred vision, double vision or yellow sclerae.No hearing loss, sneezing, congestion, runny nose or sore throat.  SKIN: No rash or itching.  CARDIOVASCULAR: per hpi RESPIRATORY: per hpi GASTROINTESTINAL: No anorexia, nausea, vomiting or diarrhea. No abdominal pain or blood.  GENITOURINARY: No burning on urination, no polyuria NEUROLOGICAL: No headache, dizziness, syncope, paralysis, ataxia, numbness or tingling in the extremities. No change in bowel or bladder control.  MUSCULOSKELETAL: No muscle, back pain, joint pain or stiffness.  LYMPHATICS: No enlarged nodes. No history of splenectomy.  PSYCHIATRIC: No history of depression or anxiety.  ENDOCRINOLOGIC: No reports of sweating, cold or heat intolerance. No polyuria or polydipsia.  Marland Kitchen   Physical Examination Vitals:   09/20/17 1004 09/20/17 1007  BP: 136/84 136/80  Pulse: 82   SpO2: 97%    Vitals:   09/20/17 1004  Weight: 187 lb (84.8 kg)  Height: 5\' 6"  (1.676 m)    Gen: resting comfortably, no acute distress HEENT: no scleral icterus, pupils equal round and reactive, no palptable cervical adenopathy,  CV: RRR, no m/r/g, no jvd Resp: Clear to auscultation bilaterally GI: abdomen is soft, non-tender, non-distended, normal bowel sounds, no hepatosplenomegaly MSK: extremities are warm, no edema.  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 The [place] ventricularejection fractionis 55-60%. Diastolictransmitral flowprofile and mitralannular tissueDoppler signalcharacteristic ofimpaired left ventricularrelaxation. Mitral Valve The mitralvalve is mildlythickened withnormal leafletmobility. Thereis no echocardiographicevidence ofpathologic mitral regurgitationby colorflow or continuouswave Doppler imaging. Left Atrium The leftatrium is mildlydilated. Aortic Valve The aorticvalve is trileafletwith normalexcursion. Thereis no echocardiographicevidence ofaortic regurgitationby colorflow or continuouswave Doppler examinations. Thereis no evidence ofhemodynamicallyimportant aortictransvalvular gradient;the maximal instantaneousleftventricular outflowvelocity is 0.110m/s. Aorta The thoracicaorta is normalin diameterat the level ofthe left ventricularoutflow tract,sinuses of Valsalva,sinotubularjunction and transversearch. Pulmonary Artery The pulmonaryartery issuboptimally imaged;pulmonary arteryis probablynormal in diameter. Pulmonic Valve The pulmonaryvalve isnormal. Thereis no detectablepulmonary regurgitationby colorflow Doppler imaging. Right Ventricle Thereis normal right ventricularchambersize and contraction. Tricuspid Valve The tricuspidvalve isstructurally normal. The signalis of suboptimaltechnical quality;right ventricularand pulmonaryarterial systolicpressure cannotbe accuratelyestimated from  thisexamination. Right Atrium The rightatrium is normalin size. Inferior Vena Cava The inferiorvena cavais normal in sizewith respiration characteristicof normalcentral venousand right atrialpressures; the estimatedcentral venousand right atrialpressuresare 5-10 mm Hg. Pericardium Thereis no evidence ofpericardial effusion. Other Thereis no evidence ofintracardiac shuntby color flowDoppler imaging.   Assessment and Plan  1. Chest pain - chest pain in the setting of multiple CAD risk factors - we will obtain a dobutamine nuclear stress test   F/u pending test results   Arnoldo Lenis, M.D.

## 2017-10-07 ENCOUNTER — Encounter: Payer: Self-pay | Admitting: Cardiology

## 2019-02-22 ENCOUNTER — Other Ambulatory Visit (HOSPITAL_COMMUNITY): Payer: Self-pay | Admitting: Family Medicine

## 2019-02-22 DIAGNOSIS — Z13820 Encounter for screening for osteoporosis: Secondary | ICD-10-CM

## 2019-02-22 DIAGNOSIS — Z1231 Encounter for screening mammogram for malignant neoplasm of breast: Secondary | ICD-10-CM

## 2019-03-20 ENCOUNTER — Other Ambulatory Visit: Payer: Self-pay

## 2019-03-20 ENCOUNTER — Ambulatory Visit (HOSPITAL_COMMUNITY)
Admission: RE | Admit: 2019-03-20 | Discharge: 2019-03-20 | Disposition: A | Discharge status: Home / Self Care | Payer: Medicare HMO | Source: Ambulatory Visit | Attending: Family Medicine | Admitting: Family Medicine

## 2019-03-20 DIAGNOSIS — Z780 Asymptomatic menopausal state: Secondary | ICD-10-CM | POA: Insufficient documentation

## 2019-03-20 DIAGNOSIS — Z13820 Encounter for screening for osteoporosis: Secondary | ICD-10-CM | POA: Diagnosis present

## 2019-03-20 DIAGNOSIS — Z1231 Encounter for screening mammogram for malignant neoplasm of breast: Secondary | ICD-10-CM

## 2020-04-15 ENCOUNTER — Other Ambulatory Visit: Payer: Self-pay

## 2020-04-15 ENCOUNTER — Encounter (HOSPITAL_COMMUNITY): Payer: Self-pay | Admitting: Physical Therapy

## 2020-04-15 ENCOUNTER — Ambulatory Visit (HOSPITAL_COMMUNITY): Payer: Medicare HMO | Attending: Neurology | Admitting: Physical Therapy

## 2020-04-15 DIAGNOSIS — M6281 Muscle weakness (generalized): Secondary | ICD-10-CM | POA: Insufficient documentation

## 2020-04-15 DIAGNOSIS — R2689 Other abnormalities of gait and mobility: Secondary | ICD-10-CM | POA: Diagnosis present

## 2020-04-15 NOTE — Therapy (Signed)
[employer] [place], [place], [address] Phone: [phone]   Fax:  [phone]  Physical Therapy Evaluation  Patient Details  Name: Diana Kent MRN: [id number] Date of Birth: 1941-03-30 Referring Provider (PT): Phillips Odor MD    Encounter Date: 04/15/2020   PT End of Session - 04/15/20 1341    Visit Number 1    Number of Visits 12    Date for PT Re-Evaluation 05/31/20    Authorization Type [employer]; Medicaid secondary    Authorization Time Period check auth    Authorization - Visit Number 1    Authorization - Number of Visits 1    PT Start Time 1310    PT Stop Time 1348    PT Time Calculation (min) 38 min    Equipment Utilized During Treatment Gait belt    Activity Tolerance Patient tolerated treatment well    Behavior During Therapy WFL for tasks assessed/performed           Past Medical History:  Diagnosis Date  . Acid reflux   . Arthritis   . Asthma   . CKD (chronic kidney disease) stage 3, GFR 30-59 ml/min (HCC) 06/04/2015  . Glaucoma   . Gross hematuria 06/04/2015  . Hyperlipidemia   . Hypertension   . Renal cysts, acquired, bilateral 06/04/2015  . Seizure ([place])   . Sleep apnea   . Stroke [facility])     Past Surgical History:  Procedure Laterality Date  . COLONOSCOPY WITH PROPOFOL N/A 08/04/2017   Procedure: COLONOSCOPY WITH PROPOFOL;  Surgeon: Manya Silvas, MD;  Location: [facility] ENDOSCOPY;  Service: Endoscopy;  Laterality: N/A;  . HAND SURGERY    . PARTIAL HYSTERECTOMY      There were no vitals filed for this visit.    Subjective Assessment - 04/15/20 1324    Subjective Patient presents to therapy with complaint of decreased balance and unsteadiness. She says this has been bothering her for about a year. Patient says she has had recent falls because of this, but none resulting in injury. Says she uses a cane during the day when she feels dizzy. Noted history in chart of cognitive  disorder, memory impairment, epilepsy, HTN, and seizures. Does not remember the last time she had a seizure. Says last epileptic episode was 1-2 months ago. Says she is taking medication for these issues.    Pertinent History Seizures, epilepsy, HTN, hypothyroidism, memory impairment, cognitive disorder    Limitations House hold activities;Walking;Standing    Patient Stated Goals Better balance    Currently in Pain? Yes    Pain Score 8     Pain Location Knee    Pain Orientation Right;Left    Pain Descriptors / Indicators Aching;Sore    Pain Type Chronic pain    Pain Onset More than a month ago    Pain Frequency Intermittent    Aggravating Factors  walking, standing, WB    Pain Relieving Factors sitting, rest    Effect of Pain on Daily Activities Limits              [facility] PT Assessment - 04/15/20 0001      Assessment   Medical Diagnosis abnormal gait     Referring Provider (PT) Phillips Odor MD     Onset Date/Surgical Date --   02/2020     Precautions   Precautions Fall      Restrictions   Weight Bearing Restrictions No      Balance  Screen   Has the patient fallen in the past 6 months Yes    How many times? 3    Has the patient had a decrease in activity level because of a fear of falling?  Yes    Is the patient reluctant to leave their home because of a fear of falling?  No      Home Environment   Living Environment Private residence    Living Arrangements Children      Prior Function   Level of Independence Needs assistance with ADLs    Comments Sasy her son helps her with cooking and laundry due to trouble with balance and stairs       Cognition   Overall Cognitive Status Impaired/Different from baseline      Transfers   Five time sit to stand comments  32 sec with UEs       Ambulation/Gait   Ambulation/Gait Yes    Ambulation/Gait Assistance 5: Supervision    Ambulation Distance (Feet) 315 Feet    Assistive device None    Gait Pattern Decreased arm swing -  right;Decreased arm swing - left;Decreased step length - right;Decreased step length - left;Decreased stride length;Decreased dorsiflexion - right;Decreased dorsiflexion - left    Ambulation Surface Level;Indoor    Gait Comments 2MWT      Balance   Balance Assessed Yes      Static Standing Balance   Static Standing Balance -  Activities  Tandam Stance - Right Leg;Tandam Stance - Left Leg    Static Standing - Comment/# of Minutes 8 sec, 6 sec                       Objective measurements completed on examination: See above findings.               PT Education - 04/15/20 1327    Education Details on evaluation findings, and POC    Person(s) Educated Patient    Methods Explanation    Comprehension Verbalized understanding            PT Short Term Goals - 04/15/20 1345      PT SHORT TERM GOAL #1   Title Patient will be independent with initial HEP and self-management strategies to improve functional outcomes    Time 3    Period Weeks    Status New    Target Date 05/10/20      PT SHORT TERM GOAL #2   Title Patient will be able to perform stand x 5 in < 25 seconds to demonstrate improvement in functional mobility and reduced risk for falls.    Time 3    Period Weeks    Status New    Target Date 05/10/20             PT Long Term Goals - 04/15/20 1815      PT LONG TERM GOAL #1   Title Patient will report at least 60% overall improvement in subjective complaint to indicate improvement in ability to perform ADLs.    Time 6    Period Weeks    Status New    Target Date 05/31/20      PT LONG TERM GOAL #2   Title Patient will be able to ambulate at least 375 feet during 2MWT with LRAD to demonstrate improved ability to perform functional mobility and associated tasks.    Time 6    Period Weeks    Status New  Target Date 05/31/20      PT LONG TERM GOAL #3   Title Patient will be able to perform stand x 5 in < 15 seconds to demonstrate  improvement in functional mobility and reduced risk for falls.    Time 6    Period Weeks    Status New    Target Date 05/31/20      PT LONG TERM GOAL #4   Title Patient will be able to maintain tandem stance >30 seconds on BLEs to improve stability and reduce risk for falls    Time 6    Period Weeks    Status New    Target Date 05/31/20                  Plan - 04/15/20 1342    Clinical Impression Statement Patient is a 79 y.o. female who presents to physical therapy with complaint of decreased balance, gait difficulty. Patient demonstrates decreased strength, balance deficits and gait abnormalities which are negatively impacting patient ability to perform ADLs and functional mobility tasks. Patient will benefit from skilled physical therapy services to address these deficits to improve level of function with ADLs, functional mobility tasks, and reduce risk for falls.    Personal Factors and Comorbidities Comorbidity 3+    Comorbidities Seizures, epilepsy, HTN, hypothyroidism, memory impairment, cognitive disorder    Examination-Activity Limitations Stairs;Transfers;Locomotion Level    Examination-Participation Restrictions Laundry;Community Activity;Yard Work    Merchant [employer] officer Stable/Uncomplicated    Designer, jewellery Low    Rehab Potential Fair    PT Frequency 2x / week    PT Duration 6 weeks    PT Treatment/Interventions ADLs/Self Care Home Management;Aquatic Therapy;Biofeedback;Cryotherapy;Ultrasound;Parrafin;Therapeutic activities;Fluidtherapy;Patient/family education;Manual lymph drainage;Dry needling;Energy conservation;Splinting;Passive range of motion;Scar mobilization;Vasopneumatic Device;Joint Manipulations;Taping;Compression bandaging;Prosthetic Training;Balance training;Therapeutic exercise;Orthotic Fit/Training;Contrast Bath;Electrical Stimulation;DME Instruction;Iontophoresis 4mg /ml Dexamethasone;Gait training;Neuromuscular re-education;Stair  training;Moist Heat;Traction;Functional mobility training;Cognitive remediation;Manual techniques;Spinal Manipulations;Vestibular;Visual/perceptual remediation/compensation;Canalith Repostioning    PT Next Visit Plan Review goals and issue basic HEP for LE strengthening. Progress functional strength and balance as able. Add gait and dynamaic balance training when ready    PT Home Exercise Plan Issue Next visit    Consulted and Agree with Plan of Care Patient           Patient will benefit from skilled therapeutic intervention in order to improve the following deficits and impairments:  Abnormal gait, Pain, Decreased balance, Difficulty walking, Decreased strength, Dizziness, Improper body mechanics  Visit Diagnosis: Muscle weakness (generalized)  Other abnormalities of gait and mobility     Problem List Patient Active Problem List   Diagnosis Date Noted  . Renal cysts, acquired, bilateral 06/04/2015  . CKD (chronic kidney disease) stage 3, GFR 30-59 ml/min (HCC) 06/04/2015  . Gross hematuria 06/04/2015    6:20 PM, 04/15/20 Josue Hector PT DPT  Physical Therapist with [facility]  [phone] [place], [place], [address] Phone: [phone]   Fax:  [phone]  Name: Diana Kent MRN: [id number] Date of Birth: 1941-02-06

## 2020-04-18 ENCOUNTER — Ambulatory Visit (HOSPITAL_COMMUNITY): Payer: Medicare HMO

## 2020-04-18 ENCOUNTER — Encounter (HOSPITAL_COMMUNITY): Payer: Self-pay

## 2020-04-18 ENCOUNTER — Other Ambulatory Visit: Payer: Self-pay

## 2020-04-18 DIAGNOSIS — M6281 Muscle weakness (generalized): Secondary | ICD-10-CM | POA: Diagnosis not present

## 2020-04-18 DIAGNOSIS — R2689 Other abnormalities of gait and mobility: Secondary | ICD-10-CM

## 2020-04-18 NOTE — Patient Instructions (Signed)
Functional Quadriceps: Sit to Stand    Sit on edge of chair, feet flat on floor. Stand upright, extending knees fully. Repeat 5 times per set. Do 2 sets per session. Do 1 sessions per day.  http://orth.exer.us/735   Copyright  VHI. All rights reserved.   Toe / Heel Raise (Standing)    Standing with support, raise heels. Standing tall with hand support raise toes. Repeat 10 times, hold 3".  Copyright  VHI. All rights reserved.   Tandem Stance    Standing tall by counter or sink for safety. Right foot in front of left, heel touching toe both feet "straight ahead". Stand on Foot Triangle of Support with both feet.  Balance in this position 15 seconds.  Increase hold time as you improve with static balance. Do with left foot in front of right.  Copyright  VHI. All rights reserved.

## 2020-04-18 NOTE — Therapy (Signed)
[place], [place], [address] Phone: [phone]   Fax:  [phone]  Physical Therapy Treatment  Patient Details  Name: Diana Kent MRN: [id number] Date of Birth: [birth year] Referring Provider (PT): Phillips Odor MD    Encounter Date: 04/18/2020   PT End of Session - 04/18/20 1454    Visit Number 2    Number of Visits 12    Date for PT Re-Evaluation 05/31/20    Authorization Type [employer] Medicare; Medicaid secondary    Authorization Time Period Cohere approved 12 visits from 10/25-->05/31/20    Authorization - Visit Number 1    Authorization - Number of Visits 12    Progress Note Due on Visit 10    PT Start Time 1450    PT Stop Time 1529    PT Time Calculation (min) 39 min    Equipment Utilized During Treatment Gait belt    Activity Tolerance Patient tolerated treatment well    Behavior During Therapy WFL for tasks assessed/performed           Past Medical History:  Diagnosis Date  . Acid reflux   . Arthritis   . Asthma   . CKD (chronic kidney disease) stage 3, GFR 30-59 ml/min (HCC) 06/04/2015  . Glaucoma   . Gross hematuria 06/04/2015  . Hyperlipidemia   . Hypertension   . Renal cysts, acquired, bilateral 06/04/2015  . Seizure ([place])   . Sleep apnea   . Stroke [facility])     Past Surgical History:  Procedure Laterality Date  . COLONOSCOPY WITH PROPOFOL N/A 08/04/2017   Procedure: COLONOSCOPY WITH PROPOFOL;  Surgeon: Manya Silvas, MD;  Location: [facility] ENDOSCOPY;  Service: Endoscopy;  Laterality: N/A;  . HAND SURGERY    . PARTIAL HYSTERECTOMY      There were no vitals filed for this visit.   Subjective Assessment - 04/18/20 1453    Subjective Pt stated she is fair today.  Reports Rt ankle is tender today, pain scale 5/10 today.  No reports of recent fall.    Pertinent History Seizures, epilepsy, HTN, hypothyroidism, memory impairment, cognitive disorder    Patient Stated Goals Better  balance    Currently in Pain? Yes    Pain Score 5     Pain Location Ankle    Pain Orientation Right    Pain Descriptors / Indicators Tender    Pain Type Chronic pain    Pain Onset More than a month ago    Pain Frequency Intermittent    Aggravating Factors  walking, standing, WB    Pain Relieving Factors sitting, rest    Effect of Pain on Daily Activities limits                             [facility] Adult PT Treatment/Exercise - 04/18/20 0001      Exercises   Exercises Knee/Hip      Knee/Hip Exercises: Standing   Heel Raises 10 reps    Heel Raises Limitations 10    Hip Abduction 10 reps    Hip Extension 10 reps    Extension Limitations cueing for posture    Forward Step Up Both;10 reps;Hand Hold: 1;Step Height: 4"    Other Standing Knee Exercises tandem stance 2x 30"      Knee/Hip Exercises: Seated   Sit to Sand 5 reps;without UE support  PT Education - 04/18/20 1502    Education Details Reviewed goals, educated importance of HEP compliance that was established this session.  Pt able to demonstrate and verbalized understanding for techniques and safety wiht exercises complete infront of counter at home.    Person(s) Educated Patient    Methods Explanation;Demonstration;Handout    Comprehension Returned demonstration;Verbalized understanding            PT Short Term Goals - 04/15/20 1345      PT SHORT TERM GOAL #1   Title Patient will be independent with initial HEP and self-management strategies to improve functional outcomes    Time 3    Period Weeks    Status New    Target Date 05/10/20      PT SHORT TERM GOAL #2   Title Patient will be able to perform stand x 5 in < 25 seconds to demonstrate improvement in functional mobility and reduced risk for falls.    Time 3    Period Weeks    Status New    Target Date 05/10/20             PT Long Term Goals - 04/15/20 1815      PT LONG TERM GOAL #1   Title Patient will  report at least 60% overall improvement in subjective complaint to indicate improvement in ability to perform ADLs.    Time 6    Period Weeks    Status New    Target Date 05/31/20      PT LONG TERM GOAL #2   Title Patient will be able to ambulate at least 375 feet during 2MWT with LRAD to demonstrate improved ability to perform functional mobility and associated tasks.    Time 6    Period Weeks    Status New    Target Date 05/31/20      PT LONG TERM GOAL #3   Title Patient will be able to perform stand x 5 in < 15 seconds to demonstrate improvement in functional mobility and reduced risk for falls.    Time 6    Period Weeks    Status New    Target Date 05/31/20      PT LONG TERM GOAL #4   Title Patient will be able to maintain tandem stance >30 seconds on BLEs to improve stability and reduce risk for falls    Time 6    Period Weeks    Status New    Target Date 05/31/20                 Plan - 04/18/20 1631    Clinical Impression Statement Reviewed goals, educated importance of HEP compliance that was established this session.  Session focus with LE strengthening and static balance training.  Pt encouraged to complete all HEP with UE support for safety.  Able to demonstrate tandem stance 10-15" without HHA prior LOB episodes.    Personal Factors and Comorbidities Comorbidity 3+    Comorbidities Seizures, epilepsy, HTN, hypothyroidism, memory impairment, cognitive disorder    Examination-Activity Limitations Stairs;Transfers;Locomotion Level    Examination-Participation Restrictions Laundry;Community Activity;Yard Work    Merchant [employer] officer Stable/Uncomplicated    Designer, jewellery Low    Rehab Potential Fair    PT Frequency 2x / week    PT Duration 6 weeks    PT Treatment/Interventions ADLs/Self Care Home Management;Aquatic Therapy;Biofeedback;Cryotherapy;Ultrasound;Parrafin;Therapeutic activities;Fluidtherapy;Patient/family education;Manual lymph  drainage;Dry needling;Energy conservation;Splinting;Passive range of motion;Scar mobilization;Vasopneumatic Device;Joint Manipulations;Taping;Compression bandaging;Prosthetic Training;Balance training;Therapeutic exercise;Orthotic Fit/Training;Contrast Bath;Electrical  Stimulation;DME Instruction;Iontophoresis 4mg /ml Dexamethasone;Gait training;Neuromuscular re-education;Stair training;Moist Heat;Traction;Functional mobility training;Cognitive remediation;Manual techniques;Spinal Manipulations;Vestibular;Visual/perceptual remediation/compensation;Canalith Repostioning    PT Next Visit Plan Review compliance with HEP for LE strengthening. Progress functional strength and balance as able. Add gait and dynamaic balance training when ready    PT Home Exercise Plan 10/28: heel raises, STS and tandem stance with HHA           Patient will benefit from skilled therapeutic intervention in order to improve the following deficits and impairments:  Abnormal gait, Pain, Decreased balance, Difficulty walking, Decreased strength, Dizziness, Improper body mechanics  Visit Diagnosis: Other abnormalities of gait and mobility  Muscle weakness (generalized)     Problem List Patient Active Problem List   Diagnosis Date Noted  . Renal cysts, acquired, bilateral 06/04/2015  . CKD (chronic kidney disease) stage 3, GFR 30-59 ml/min (HCC) 06/04/2015  . Gross hematuria 06/04/2015   Ihor Austin, LPTA/CLT; CBIS [phone]  Aldona Lento 04/18/2020, 4:35 PM  [place] [address] [place], [place], [address] Phone: [phone]   Fax:  [phone]  Name: Odella Appelhans MRN: [id number] Date of Birth: 29-Apr-1941

## 2020-04-23 ENCOUNTER — Ambulatory Visit (HOSPITAL_COMMUNITY): Payer: Medicare HMO | Admitting: Physical Therapy

## 2020-04-23 ENCOUNTER — Telehealth (HOSPITAL_COMMUNITY): Payer: Self-pay | Admitting: Physical Therapy

## 2020-04-23 NOTE — Telephone Encounter (Signed)
pt called to cancel due to no transportation

## 2020-04-25 ENCOUNTER — Encounter (HOSPITAL_COMMUNITY): Payer: Self-pay

## 2020-04-25 ENCOUNTER — Other Ambulatory Visit: Payer: Self-pay

## 2020-04-25 ENCOUNTER — Ambulatory Visit (HOSPITAL_COMMUNITY): Payer: Medicare HMO | Attending: Neurology

## 2020-04-25 DIAGNOSIS — R2689 Other abnormalities of gait and mobility: Secondary | ICD-10-CM | POA: Diagnosis present

## 2020-04-25 DIAGNOSIS — M6281 Muscle weakness (generalized): Secondary | ICD-10-CM | POA: Diagnosis present

## 2020-04-25 NOTE — Therapy (Signed)
[place], [place], [address] Phone: [phone]   Fax:  [phone]  Physical Therapy Treatment  Patient Details  Name: Diana Kent MRN: [id number] Date of Birth: January 17, 1941 Referring Provider (PT): Phillips Odor MD    Encounter Date: 04/25/2020   PT End of Session - 04/25/20 1507    Visit Number 3    Number of Visits 12    Date for PT Re-Evaluation 05/31/20    Authorization Type [employer] Medicare; Medicaid secondary    Authorization Time Period Co[employer] approved 12 visits from 10/25-->05/31/20    Authorization - Visit Number 2    Authorization - Number of Visits 12    Progress Note Due on Visit 10    PT Start Time 1448    PT Stop Time 1528    PT Time Calculation (min) 40 min    Equipment Utilized During Treatment Gait belt    Activity Tolerance Patient tolerated treatment well    Behavior During Therapy [facility] for tasks assessed/performed           Past Medical History:  Diagnosis Date  . Acid reflux   . Arthritis   . Asthma   . CKD (chronic kidney disease) stage 3, GFR 30-59 ml/min (HCC) 06/04/2015  . Glaucoma   . Gross hematuria 06/04/2015  . Hyperlipidemia   . Hypertension   . Renal cysts, acquired, bilateral 06/04/2015  . Seizure ([place])   . Sleep apnea   . Stroke [facility])     Past Surgical History:  Procedure Laterality Date  . COLONOSCOPY WITH PROPOFOL N/A 08/04/2017   Procedure: COLONOSCOPY WITH PROPOFOL;  Surgeon: Manya Silvas, MD;  Location: [facility] ENDOSCOPY;  Service: Endoscopy;  Laterality: N/A;  . HAND SURGERY    . PARTIAL HYSTERECTOMY      There were no vitals filed for this visit.   Subjective Assessment - 04/25/20 1452    Subjective Pt reports her back is bothering her a little today, reports she did gardening picking and clean greens yesterday.  Reports she has been compliant with HEP twice daily at home, reports she continues to have difficulty with balance exercise    Pertinent  History Seizures, epilepsy, HTN, hypothyroidism, memory impairment, cognitive disorder    Currently in Pain? Yes    Pain Score 5     Pain Location Back    Pain Orientation Lower    Pain Descriptors / Indicators Sore    Pain Type Chronic pain    Pain Onset More than a month ago    Pain Frequency Intermittent    Aggravating Factors  walking, standing, WB    Pain Relieving Factors sitting, rest    Effect of Pain on Daily Activities limits                             [facility] Adult PT Treatment/Exercise - 04/25/20 0001      Knee/Hip Exercises: Standing   Heel Raises 10 reps    Forward Step Up Both;10 reps;Hand Hold: 1;Hand Hold: 2;Step Height: 6"    Functional Squat 2 sets;5 reps    Functional Squat Limitations cueing for mechanics, front of chair    Other Standing Knee Exercises tandem stance 3x 30", minimal UE A    Other Standing Knee Exercises sidestep 2x down blue line      Knee/Hip Exercises: Seated   Sit to Sand 2 sets;5 reps;without UE support   eccentric  control                   PT Short Term Goals - 04/15/20 1345      PT SHORT TERM GOAL #1   Title Patient will be independent with initial HEP and self-management strategies to improve functional outcomes    Time 3    Period Weeks    Status New    Target Date 05/10/20      PT SHORT TERM GOAL #2   Title Patient will be able to perform stand x 5 in < 25 seconds to demonstrate improvement in functional mobility and reduced risk for falls.    Time 3    Period Weeks    Status New    Target Date 05/10/20             PT Long Term Goals - 04/15/20 1815      PT LONG TERM GOAL #1   Title Patient will report at least 60% overall improvement in subjective complaint to indicate improvement in ability to perform ADLs.    Time 6    Period Weeks    Status New    Target Date 05/31/20      PT LONG TERM GOAL #2   Title Patient will be able to ambulate at least 375 feet during 2MWT with LRAD to  demonstrate improved ability to perform functional mobility and associated tasks.    Time 6    Period Weeks    Status New    Target Date 05/31/20      PT LONG TERM GOAL #3   Title Patient will be able to perform stand x 5 in < 15 seconds to demonstrate improvement in functional mobility and reduced risk for falls.    Time 6    Period Weeks    Status New    Target Date 05/31/20      PT LONG TERM GOAL #4   Title Patient will be able to maintain tandem stance >30 seconds on BLEs to improve stability and reduce risk for falls    Time 6    Period Weeks    Status New    Target Date 05/31/20                 Plan - 04/25/20 1517    Clinical Impression Statement Reviewed compliance wiht HEP, pt able to demonstrate appropriate mechanics.  Noted improved static balance during tandem stance, occasional HHA.  Continued functional strengthening wiht addional squats for gluteal strengthening and able to increased step up height.    Personal Factors and Comorbidities Comorbidity 3+    Comorbidities Seizures, epilepsy, HTN, hypothyroidism, memory impairment, cognitive disorder    Examination-Activity Limitations Stairs;Transfers;Locomotion Level    Examination-Participation Restrictions Laundry;Community Activity;Yard Work    Merchant navy officer Stable/Uncomplicated    Designer, jewellery Low    Rehab Potential Fair    PT Frequency 2x / week    PT Duration 6 weeks    PT Treatment/Interventions ADLs/Self Care Home Management;Aquatic Therapy;Biofeedback;Cryotherapy;Ultrasound;Parrafin;Therapeutic activities;Fluidtherapy;Patient/family education;Manual lymph drainage;Dry needling;Energy conservation;Splinting;Passive range of motion;Scar mobilization;Vasopneumatic Device;Joint Manipulations;Taping;Compression bandaging;Prosthetic Training;Balance training;Therapeutic exercise;Orthotic Fit/Training;Contrast Bath;Electrical Stimulation;DME Instruction;Iontophoresis 4mg /ml  Dexamethasone;Gait training;Neuromuscular re-education;Stair training;Moist Heat;Traction;Functional mobility training;Cognitive remediation;Manual techniques;Spinal Manipulations;Vestibular;Visual/perceptual remediation/compensation;Canalith Repostioning    PT Next Visit Plan Progress functional strength and balance as able. Add gait and dynamaic balance training when ready    PT Home Exercise Plan 10/28: heel raises, STS and tandem stance with HHA  Patient will benefit from skilled therapeutic intervention in order to improve the following deficits and impairments:  Abnormal gait, Pain, Decreased balance, Difficulty walking, Decreased strength, Dizziness, Improper body mechanics  Visit Diagnosis: Other abnormalities of gait and mobility  Muscle weakness (generalized)     Problem List Patient Active Problem List   Diagnosis Date Noted  . Renal cysts, acquired, bilateral 06/04/2015  . CKD (chronic kidney disease) stage 3, GFR 30-59 ml/min (HCC) 06/04/2015  . Gross hematuria 06/04/2015   Ihor Austin, LPTA/CLT; CBIS [phone]  Aldona Lento 04/25/2020, 3:31 PM  [place], [place], [address] Phone: [phone]   Fax:  [phone]  Name: Diana Kent MRN: [id number] Date of Birth: [birth year]

## 2020-04-30 ENCOUNTER — Encounter (HOSPITAL_COMMUNITY): Payer: Self-pay | Admitting: Physical Therapy

## 2020-04-30 ENCOUNTER — Other Ambulatory Visit: Payer: Self-pay

## 2020-04-30 ENCOUNTER — Ambulatory Visit (HOSPITAL_COMMUNITY): Payer: Medicare HMO | Admitting: Physical Therapy

## 2020-04-30 DIAGNOSIS — M6281 Muscle weakness (generalized): Secondary | ICD-10-CM

## 2020-04-30 DIAGNOSIS — R2689 Other abnormalities of gait and mobility: Secondary | ICD-10-CM | POA: Diagnosis not present

## 2020-04-30 NOTE — Therapy (Signed)
[place], [place], [address] Phone: [phone]   Fax:  [phone]  Physical Therapy Treatment  Patient Details  Name: Diana Kent MRN: [id number] Date of Birth: January 12, 1941 Referring Provider (PT): Phillips Odor MD    Encounter Date: 04/30/2020   PT End of Session - 04/30/20 1532    Visit Number 4    Number of Visits 12    Date for PT Re-Evaluation 05/31/20    Authorization Type [employer] Medicare; Medicaid secondary    Authorization Time Period Cohere approved 12 visits from 10/25-->05/31/20    Authorization - Visit Number 3    Authorization - Number of Visits 12    Progress Note Due on Visit 10    PT Start Time 4315    PT Stop Time 1605    PT Time Calculation (min) 40 min    Equipment Utilized During Treatment Gait belt    Activity Tolerance Patient tolerated treatment well    Behavior During Therapy WFL for tasks assessed/performed           Past Medical History:  Diagnosis Date   Acid reflux    Arthritis    Asthma    CKD (chronic kidney disease) stage 3, GFR 30-59 ml/min ([place]) 06/04/2015   Glaucoma    Gross hematuria 06/04/2015   Hyperlipidemia    Hypertension    Renal cysts, acquired, bilateral 06/04/2015   Seizure ([place])    Sleep apnea    Stroke [facility])     Past Surgical History:  Procedure Laterality Date   COLONOSCOPY WITH PROPOFOL N/A 08/04/2017   Procedure: COLONOSCOPY WITH PROPOFOL;  Surgeon: Manya Silvas, MD;  Location: [facility] ENDOSCOPY;  Service: Endoscopy;  Laterality: N/A;   HAND SURGERY     PARTIAL HYSTERECTOMY      There were no vitals filed for this visit.   Subjective Assessment - 04/30/20 1531    Subjective Patient says she feels no different. Patient says she feels like her balance is worse. Says she cant stand well. Says she fell in her kitchen last week.    Patient is accompained by: Family member   son   Pertinent History Seizures, epilepsy, HTN,  hypothyroidism, memory impairment, cognitive disorder    Currently in Pain? Yes    Pain Score 9     Pain Location Back    Pain Orientation Lower    Pain Descriptors / Indicators Aching    Pain Type Chronic pain    Pain Onset More than a month ago    Pain Frequency Intermittent                             [facility] Adult PT Treatment/Exercise - 04/30/20 0001      Knee/Hip Exercises: Standing   Heel Raises Both;15 reps    Hip Abduction Both;1 set;15 reps    Forward Step Up Both;1 set;15 reps;Hand Hold: 2;Step Height: 6"    Gait Training 100 feet with no AD cues for foot position     Other Standing Knee Exercises tandem stance 3x 15" with intermittent HHA               Balance Exercises - 04/30/20 0001      Balance Exercises: Standing   Tandem Gait Forward;2 reps               PT Short Term Goals - 04/15/20 1345  PT SHORT TERM GOAL #1   Title Patient will be independent with initial HEP and self-management strategies to improve functional outcomes    Time 3    Period Weeks    Status New    Target Date 05/10/20      PT SHORT TERM GOAL #2   Title Patient will be able to perform stand x 5 in < 25 seconds to demonstrate improvement in functional mobility and reduced risk for falls.    Time 3    Period Weeks    Status New    Target Date 05/10/20             PT Long Term Goals - 04/15/20 1815      PT LONG TERM GOAL #1   Title Patient will report at least 60% overall improvement in subjective complaint to indicate improvement in ability to perform ADLs.    Time 6    Period Weeks    Status New    Target Date 05/31/20      PT LONG TERM GOAL #2   Title Patient will be able to ambulate at least 375 feet during 2MWT with LRAD to demonstrate improved ability to perform functional mobility and associated tasks.    Time 6    Period Weeks    Status New    Target Date 05/31/20      PT LONG TERM GOAL #3   Title Patient will be able to perform  stand x 5 in < 15 seconds to demonstrate improvement in functional mobility and reduced risk for falls.    Time 6    Period Weeks    Status New    Target Date 05/31/20      PT LONG TERM GOAL #4   Title Patient will be able to maintain tandem stance >30 seconds on BLEs to improve stability and reduce risk for falls    Time 6    Period Weeks    Status New    Target Date 05/31/20                 Plan - 04/30/20 1623    Clinical Impression Statement Patient tolerated session well today. Patient well challenged with balance progressions. Patient required HHA for step ups. Patient cued on sequencing with tandem gait, but was able to perform with no LOB. Addressed patient questions and educated patient and son on purpose and function of static balance activity and progressions. Patient will continue to benefit from skilled therapy services to progress strength and balance to improve functional mobility and reduce risk for falls.    Personal Factors and Comorbidities Comorbidity 3+    Comorbidities Seizures, epilepsy, HTN, hypothyroidism, memory impairment, cognitive disorder    Examination-Activity Limitations Stairs;Transfers;Locomotion Level    Examination-Participation Restrictions Laundry;Community Activity;Yard Work    Stability/Clinical Decision Making Stable/Uncomplicated    Rehab Potential Fair    PT Frequency 2x / week    PT Duration 6 weeks    PT Treatment/Interventions ADLs/Self Care Home Management;Aquatic Therapy;Biofeedback;Cryotherapy;Ultrasound;Parrafin;Therapeutic activities;Fluidtherapy;Patient/family education;Manual lymph drainage;Dry needling;Energy conservation;Splinting;Passive range of motion;Scar mobilization;Vasopneumatic Device;Joint Manipulations;Taping;Compression bandaging;Prosthetic Training;Balance training;Therapeutic exercise;Orthotic Fit/Training;Contrast Bath;Electrical Stimulation;DME Instruction;Iontophoresis 4mg /ml Dexamethasone;Gait  training;Neuromuscular re-education;Stair training;Moist Heat;Traction;Functional mobility training;Cognitive remediation;Manual techniques;Spinal Manipulations;Vestibular;Visual/perceptual remediation/compensation;Canalith Repostioning    PT Next Visit Plan Progress functional strength and balance as able. Continue work on foam and dynamic baalnce, add step downs next visit    PT Home Exercise Plan 10/28: heel raises, STS and tandem stance with HHA    Consulted and Agree  with Plan of Care Patient           Patient will benefit from skilled therapeutic intervention in order to improve the following deficits and impairments:  Abnormal gait, Pain, Decreased balance, Difficulty walking, Decreased strength, Dizziness, Improper body mechanics  Visit Diagnosis: Other abnormalities of gait and mobility  Muscle weakness (generalized)     Problem List Patient Active Problem List   Diagnosis Date Noted   Renal cysts, acquired, bilateral 06/04/2015   CKD (chronic kidney disease) stage 3, GFR 30-59 ml/min (HCC) 06/04/2015   Gross hematuria 06/04/2015   4:31 PM, 04/30/20 Josue Hector PT DPT  Physical Therapist with [facility]  [phone] [place], [place], [address] Phone: [phone]   Fax:  [phone]  Name: Diana Kent MRN: [id number] Date of Birth: 12-Mar-1941

## 2020-05-02 ENCOUNTER — Encounter (HOSPITAL_COMMUNITY): Payer: Self-pay | Admitting: Physical Therapy

## 2020-05-02 ENCOUNTER — Ambulatory Visit (HOSPITAL_COMMUNITY): Payer: Medicare HMO | Admitting: Physical Therapy

## 2020-05-02 ENCOUNTER — Other Ambulatory Visit: Payer: Self-pay

## 2020-05-02 DIAGNOSIS — M6281 Muscle weakness (generalized): Secondary | ICD-10-CM

## 2020-05-02 DIAGNOSIS — R2689 Other abnormalities of gait and mobility: Secondary | ICD-10-CM

## 2020-05-02 NOTE — Therapy (Signed)
[place], [place], [address] Phone: [phone]   Fax:  [phone]  Physical Therapy Treatment  Patient Details  Name: Diana Kent MRN: [id number] Date of Birth: [birth year] Referring Provider (PT): Phillips Odor MD    Encounter Date: 05/02/2020   PT End of Session - 05/02/20 1532    Visit Number 5    Number of Visits 12    Date for PT Re-Evaluation 05/31/20    Authorization Type [employer] Medicare; Medicaid secondary    Authorization Time Period Cohere approved 12 visits from 10/25-->05/31/20    Authorization - Visit Number 4    Authorization - Number of Visits 12    Progress Note Due on Visit 10    PT Start Time 6195    PT Stop Time 1612    PT Time Calculation (min) 39 min    Equipment Utilized During Treatment Gait belt    Activity Tolerance Patient tolerated treatment well    Behavior During Therapy [facility] for tasks assessed/performed           Past Medical History:  Diagnosis Date  . Acid reflux   . Arthritis   . Asthma   . CKD (chronic kidney disease) stage 3, GFR 30-59 ml/min (HCC) 06/04/2015  . Glaucoma   . Gross hematuria 06/04/2015  . Hyperlipidemia   . Hypertension   . Renal cysts, acquired, bilateral 06/04/2015  . Seizure ([place])   . Sleep apnea   . Stroke [facility])     Past Surgical History:  Procedure Laterality Date  . COLONOSCOPY WITH PROPOFOL N/A 08/04/2017   Procedure: COLONOSCOPY WITH PROPOFOL;  Surgeon: Manya Silvas, MD;  Location: [facility] ENDOSCOPY;  Service: Endoscopy;  Laterality: N/A;  . HAND SURGERY    . PARTIAL HYSTERECTOMY      There were no vitals filed for this visit.   Subjective Assessment - 05/02/20 1535    Subjective Patient states she has not fallen recently. She has been so-so lately. Sometimes she feels good and sometimes she doesn't and she is been really tired lately.    Patient is accompained by: Family member   son   Pertinent History Seizures, epilepsy,  HTN, hypothyroidism, memory impairment, cognitive disorder    Currently in Pain? Yes    Pain Score 6     Pain Location Leg    Pain Orientation Right;Left    Pain Onset More than a month ago                             [facility] Adult PT Treatment/Exercise - 05/02/20 0001      Knee/Hip Exercises: Standing   Heel Raises Both;15 reps    Hip Flexion Both;2 sets;10 reps    Lateral Step Up Both;1 set;15 reps;Hand Hold: 1;Step Height: 6"    Forward Step Up Both;1 set;15 reps;Step Height: 6";Hand Hold: 1    Step Down Both;1 set;10 reps;Hand Hold: 1;Step Height: 4"      Knee/Hip Exercises: Seated   Sit to Sand 2 sets;5 reps;without UE support                  PT Education - 05/02/20 1532    Education Details Patient educated on HEP, exercise mechanics    Person(s) Educated Patient    Methods Explanation;Demonstration    Comprehension Verbalized understanding;Returned demonstration            PT Short Term Goals -  04/15/20 1345      PT SHORT TERM GOAL #1   Title Patient will be independent with initial HEP and self-management strategies to improve functional outcomes    Time 3    Period Weeks    Status New    Target Date 05/10/20      PT SHORT TERM GOAL #2   Title Patient will be able to perform stand x 5 in < 25 seconds to demonstrate improvement in functional mobility and reduced risk for falls.    Time 3    Period Weeks    Status New    Target Date 05/10/20             PT Long Term Goals - 04/15/20 1815      PT LONG TERM GOAL #1   Title Patient will report at least 60% overall improvement in subjective complaint to indicate improvement in ability to perform ADLs.    Time 6    Period Weeks    Status New    Target Date 05/31/20      PT LONG TERM GOAL #2   Title Patient will be able to ambulate at least 375 feet during 2MWT with LRAD to demonstrate improved ability to perform functional mobility and associated tasks.    Time 6    Period  Weeks    Status New    Target Date 05/31/20      PT LONG TERM GOAL #3   Title Patient will be able to perform stand x 5 in < 15 seconds to demonstrate improvement in functional mobility and reduced risk for falls.    Time 6    Period Weeks    Status New    Target Date 05/31/20      PT LONG TERM GOAL #4   Title Patient will be able to maintain tandem stance >30 seconds on BLEs to improve stability and reduce risk for falls    Time 6    Period Weeks    Status New    Target Date 05/31/20                 Plan - 05/02/20 1534    Clinical Impression Statement Patient requires min verbal cueing for mechanics and positioning of previously completed exercises. She requires unilateral UE support for balance with step up exercise. Patient requires cueing to decrease UE support with stair exercises and has greatest difficulty with step downs secondary to impaired strength and balance. Patient showing decreased eccentric control with STS exercise secondary to impaired strength. She requires extensive cueing for eccentric phase of STS due to tendency to lean forward with limited carry over. Patient will continue to benefit from skilled physical therapy in order to reduce impairment and improve function.    Personal Factors and Comorbidities Comorbidity 3+    Comorbidities Seizures, epilepsy, HTN, hypothyroidism, memory impairment, cognitive disorder    Examination-Activity Limitations Stairs;Transfers;Locomotion Level    Examination-Participation Restrictions Laundry;Community Activity;Yard Work    Stability/Clinical Decision Making Stable/Uncomplicated    Rehab Potential Fair    PT Frequency 2x / week    PT Duration 6 weeks    PT Treatment/Interventions ADLs/Self Care Home Management;Aquatic Therapy;Biofeedback;Cryotherapy;Ultrasound;Parrafin;Therapeutic activities;Fluidtherapy;Patient/family education;Manual lymph drainage;Dry needling;Energy conservation;Splinting;Passive range of  motion;Scar mobilization;Vasopneumatic Device;Joint Manipulations;Taping;Compression bandaging;Prosthetic Training;Balance training;Therapeutic exercise;Orthotic Fit/Training;Contrast Bath;Electrical Stimulation;DME Instruction;Iontophoresis 4mg /ml Dexamethasone;Gait training;Neuromuscular re-education;Stair training;Moist Heat;Traction;Functional mobility training;Cognitive remediation;Manual techniques;Spinal Manipulations;Vestibular;Visual/perceptual remediation/compensation;Canalith Repostioning    PT Next Visit Plan Progress functional strength and balance as able. Continue work on foam and  dynamic baalnce    PT Home Exercise Plan 10/28: heel raises, STS and tandem stance with HHA    Consulted and Agree with Plan of Care Patient           Patient will benefit from skilled therapeutic intervention in order to improve the following deficits and impairments:  Abnormal gait, Pain, Decreased balance, Difficulty walking, Decreased strength, Dizziness, Improper body mechanics  Visit Diagnosis: Other abnormalities of gait and mobility  Muscle weakness (generalized)     Problem List Patient Active Problem List   Diagnosis Date Noted  . Renal cysts, acquired, bilateral 06/04/2015  . CKD (chronic kidney disease) stage 3, GFR 30-59 ml/min (HCC) 06/04/2015  . Gross hematuria 06/04/2015    4:18 PM, 05/02/20 Mearl Latin PT, DPT Physical Therapist at [place], [place], [address] Phone: [phone]   Fax:  [phone]  Name: Caelyn Route MRN: [id number] Date of Birth: Jan 05, 1941

## 2020-05-07 ENCOUNTER — Ambulatory Visit (HOSPITAL_COMMUNITY): Payer: Medicare HMO

## 2020-05-07 ENCOUNTER — Encounter (HOSPITAL_COMMUNITY): Payer: Self-pay

## 2020-05-07 ENCOUNTER — Other Ambulatory Visit: Payer: Self-pay

## 2020-05-07 DIAGNOSIS — R2689 Other abnormalities of gait and mobility: Secondary | ICD-10-CM

## 2020-05-07 DIAGNOSIS — M6281 Muscle weakness (generalized): Secondary | ICD-10-CM

## 2020-05-07 NOTE — Therapy (Signed)
[place], [place], [address] Phone: [phone]   Fax:  [phone]  Physical Therapy Treatment  Patient Details  Name: Diana Kent MRN: [id number] Date of Birth: [birth year] Referring Provider (PT): Phillips Odor MD    Encounter Date: 05/07/2020   PT End of Session - 05/07/20 1617    Visit Number 6    Number of Visits 12    Date for PT Re-Evaluation 05/31/20    Authorization Type [employer] Medicare; Medicaid secondary    Authorization Time Period Cohere approved 12 visits from 10/25-->05/31/20    Authorization - Visit Number 5    Authorization - Number of Visits 12    Progress Note Due on Visit 10    PT Start Time 1532    PT Stop Time 1613    PT Time Calculation (min) 41 min    Equipment Utilized During Treatment Gait belt    Activity Tolerance Patient tolerated treatment well    Behavior During Therapy [facility] for tasks assessed/performed           Past Medical History:  Diagnosis Date  . Acid reflux   . Arthritis   . Asthma   . CKD (chronic kidney disease) stage 3, GFR 30-59 ml/min (HCC) 06/04/2015  . Glaucoma   . Gross hematuria 06/04/2015  . Hyperlipidemia   . Hypertension   . Renal cysts, acquired, bilateral 06/04/2015  . Seizure ([place])   . Sleep apnea   . Stroke [facility])     Past Surgical History:  Procedure Laterality Date  . COLONOSCOPY WITH PROPOFOL N/A 08/04/2017   Procedure: COLONOSCOPY WITH PROPOFOL;  Surgeon: Manya Silvas, MD;  Location: [facility] ENDOSCOPY;  Service: Endoscopy;  Laterality: N/A;  . HAND SURGERY    . PARTIAL HYSTERECTOMY      There were no vitals filed for this visit.   Subjective Assessment - 05/07/20 1529    Subjective Pt reports some pain BLE 6/10.  No reports of recent fall.  Stated she doesn't feel confident carrying stuff up and down stairs.    Pertinent History Seizures, epilepsy, HTN, hypothyroidism, memory impairment, cognitive disorder    Patient Stated  Goals Better balance    Currently in Pain? Yes    Pain Score 6     Pain Location Leg    Pain Orientation Right;Left    Pain Descriptors / Indicators Aching    Pain Type Chronic pain    Pain Onset More than a month ago    Pain Frequency Intermittent    Aggravating Factors  walking, standing, WB    Pain Relieving Factors sitting, rest    Effect of Pain on Daily Activities limits                             [facility] Adult PT Treatment/Exercise - 05/07/20 0001      Knee/Hip Exercises: Standing   Heel Raises Both;5 reps;2 sets    Heel Raises Limitations squat then heel raise with red ball    Hip Flexion Both;2 sets;10 reps    Hip Flexion Limitations alternating 1 HHA 3" holds    Functional Squat 2 sets;5 reps    Functional Squat Limitations cueing for mechanics, front of chair    Stairs 4in step height reciprocal pattern carrying red ball 3x    SLS Rt 2" Lt 2" max of 3    SLS with Vectors 2x 5" with HHA  Other Standing Knee Exercises tandem stance 3x 30"    Other Standing Knee Exercises sidestep 2x down blue line      Knee/Hip Exercises: Seated   Sit to Sand 2 sets;5 reps;without UE support   5 STS 35"                   PT Short Term Goals - 04/15/20 1345      PT SHORT TERM GOAL #1   Title Patient will be independent with initial HEP and self-management strategies to improve functional outcomes    Time 3    Period Weeks    Status New    Target Date 05/10/20      PT SHORT TERM GOAL #2   Title Patient will be able to perform stand x 5 in < 25 seconds to demonstrate improvement in functional mobility and reduced risk for falls.    Time 3    Period Weeks    Status New    Target Date 05/10/20             PT Long Term Goals - 04/15/20 1815      PT LONG TERM GOAL #1   Title Patient will report at least 60% overall improvement in subjective complaint to indicate improvement in ability to perform ADLs.    Time 6    Period Weeks    Status New     Target Date 05/31/20      PT LONG TERM GOAL #2   Title Patient will be able to ambulate at least 375 feet during 2MWT with LRAD to demonstrate improved ability to perform functional mobility and associated tasks.    Time 6    Period Weeks    Status New    Target Date 05/31/20      PT LONG TERM GOAL #3   Title Patient will be able to perform stand x 5 in < 15 seconds to demonstrate improvement in functional mobility and reduced risk for falls.    Time 6    Period Weeks    Status New    Target Date 05/31/20      PT LONG TERM GOAL #4   Title Patient will be able to maintain tandem stance >30 seconds on BLEs to improve stability and reduce risk for falls    Time 6    Period Weeks    Status New    Target Date 05/31/20                 Plan - 05/07/20 1617    Clinical Impression Statement Session focus on functional strengthening and balance training.  Progressed to reciprocal pattern stairs carrying small object, pt able to complete wtih good control on 4in step height wiht 1 HR assistance.  Pt requires cueing to improve mechanics with STS as has tendency to lean forward and difficulty with eccentric control.  Increased focus today wiht balance activities as pt stated she feels more difficult, added activities with min A and cueing for mechanics to assist with balance (posture, visual points, etc..)    Personal Factors and Comorbidities Comorbidity 3+    Comorbidities Seizures, epilepsy, HTN, hypothyroidism, memory impairment, cognitive disorder    Examination-Activity Limitations Stairs;Transfers;Locomotion Level    Examination-Participation Restrictions Laundry;Community Activity;Yard Work    Merchant [employer] officer Stable/Uncomplicated    Designer, jewellery Low    Rehab Potential Fair    PT Frequency 2x / week    PT Duration 6 weeks  PT Treatment/Interventions ADLs/Self Care Home Management;Aquatic  Therapy;Biofeedback;Cryotherapy;Ultrasound;Parrafin;Therapeutic activities;Fluidtherapy;Patient/family education;Manual lymph drainage;Dry needling;Energy conservation;Splinting;Passive range of motion;Scar mobilization;Vasopneumatic Device;Joint Manipulations;Taping;Compression bandaging;Prosthetic Training;Balance training;Therapeutic exercise;Orthotic Fit/Training;Contrast Bath;Electrical Stimulation;DME Instruction;Iontophoresis 4mg /ml Dexamethasone;Gait training;Neuromuscular re-education;Stair training;Moist Heat;Traction;Functional mobility training;Cognitive remediation;Manual techniques;Spinal Manipulations;Vestibular;Visual/perceptual remediation/compensation;Canalith Repostioning    PT Next Visit Plan Progress functional strength and balance as able. Continue work on foam and dynamic baalnce    PT Home Exercise Plan 10/28: heel raises, STS and tandem stance with HHA           Patient will benefit from skilled therapeutic intervention in order to improve the following deficits and impairments:  Abnormal gait, Pain, Decreased balance, Difficulty walking, Decreased strength, Dizziness, Improper body mechanics  Visit Diagnosis: Other abnormalities of gait and mobility  Muscle weakness (generalized)     Problem List Patient Active Problem List   Diagnosis Date Noted  . Renal cysts, acquired, bilateral 06/04/2015  . CKD (chronic kidney disease) stage 3, GFR 30-59 ml/min (HCC) 06/04/2015  . Gross hematuria 06/04/2015   Ihor Austin, LPTA/CLT; CBIS [phone]  Aldona Lento 05/07/2020, 6:50 PM  [place], [place], [address] Phone: [phone]   Fax:  [phone]  Name: Marcha Licklider MRN: [id number] Date of Birth: 11-01-1940

## 2020-05-09 ENCOUNTER — Ambulatory Visit (HOSPITAL_COMMUNITY): Payer: Medicare HMO

## 2020-05-09 ENCOUNTER — Other Ambulatory Visit: Payer: Self-pay

## 2020-05-09 ENCOUNTER — Encounter (HOSPITAL_COMMUNITY): Payer: Self-pay

## 2020-05-09 VITALS — HR 68

## 2020-05-09 DIAGNOSIS — R2689 Other abnormalities of gait and mobility: Secondary | ICD-10-CM | POA: Diagnosis not present

## 2020-05-09 DIAGNOSIS — M6281 Muscle weakness (generalized): Secondary | ICD-10-CM

## 2020-05-09 NOTE — Therapy (Signed)
[place], [place], [address] Phone: [phone]   Fax:  [phone]  Physical Therapy Treatment  Patient Details  Name: Diana Kent MRN: [id number] Date of Birth: 02/26/1941 Referring Provider (PT): Phillips Odor MD    Encounter Date: 05/09/2020   PT End of Session - 05/09/20 1501    Visit Number 7    Number of Visits 12    Date for PT Re-Evaluation 05/31/20    Authorization Type [employer] Medicare; Medicaid secondary    Authorization Time Period Cohere approved 12 visits from 10/25-->05/31/20    Authorization - Visit Number 6    Authorization - Number of Visits 12    Progress Note Due on Visit 10    PT Start Time 1455    PT Stop Time 1535    PT Time Calculation (min) 40 min    Equipment Utilized During Treatment Gait belt    Activity Tolerance Patient tolerated treatment well    Behavior During Therapy [facility] for tasks assessed/performed           Past Medical History:  Diagnosis Date  . Acid reflux   . Arthritis   . Asthma   . CKD (chronic kidney disease) stage 3, GFR 30-59 ml/min (HCC) 06/04/2015  . Glaucoma   . Gross hematuria 06/04/2015  . Hyperlipidemia   . Hypertension   . Renal cysts, acquired, bilateral 06/04/2015  . Seizure ([place])   . Sleep apnea   . Stroke [facility])     Past Surgical History:  Procedure Laterality Date  . COLONOSCOPY WITH PROPOFOL N/A 08/04/2017   Procedure: COLONOSCOPY WITH PROPOFOL;  Surgeon: Manya Silvas, MD;  Location: [facility] ENDOSCOPY;  Service: Endoscopy;  Laterality: N/A;  . HAND SURGERY    . PARTIAL HYSTERECTOMY      Vitals:   05/09/20 1458  Pulse: 68  SpO2: 97%     Subjective Assessment - 05/09/20 1458    Subjective Pt reports she is still sore following last sessoin.  Reports she plans on picking greens from garden later today.    Pertinent History Seizures, epilepsy, HTN, hypothyroidism, memory impairment, cognitive disorder    Patient Stated Goals  Better balance    Currently in Pain? Yes    Pain Score 7     Pain Location Leg    Pain Orientation Right;Left    Pain Descriptors / Indicators Aching;Burning    Pain Type Chronic pain    Pain Radiating Towards Bil LE posterior ending halfway down calf    Pain Onset More than a month ago    Pain Frequency Intermittent    Aggravating Factors  walking, standing, WB    Pain Relieving Factors sitting, rest    Effect of Pain on Daily Activities limits                             [facility] Adult PT Treatment/Exercise - 05/09/20 0001      Knee/Hip Exercises: Stretches   Active Hamstring Stretch Both;2 reps;20 seconds    Active Hamstring Stretch Limitations long sit forward bend with good posture    Other Knee/Hip Stretches standing extension    Other Knee/Hip Stretches standing hip rotation      Knee/Hip Exercises: Standing   Heel Raises Both;2 sets;10 reps    Hip Flexion 2 sets   2 sets 8 reps top tapping alternating 6in step height   Hip Flexion Limitations 2 sets 8  reps top tapping alternating 6in step height    Forward Step Up Both;1 set;15 reps;Step Height: 6";Hand Hold: 1    Stairs reciprocal pattern 7in 1RT 1HR ascending, 2 descending    SLS with Vectors 2x 5" with HHA    Other Standing Knee Exercises tandem stance 3x 30"    Other Standing Knee Exercises sidestep 2x down blue line      Knee/Hip Exercises: Seated   Sit to Sand 2 sets;5 reps;without UE support   unable to control eccentric control                   PT Short Term Goals - 04/15/20 1345      PT SHORT TERM GOAL #1   Title Patient will be independent with initial HEP and self-management strategies to improve functional outcomes    Time 3    Period Weeks    Status New    Target Date 05/10/20      PT SHORT TERM GOAL #2   Title Patient will be able to perform stand x 5 in < 25 seconds to demonstrate improvement in functional mobility and reduced risk for falls.    Time 3    Period Weeks     Status New    Target Date 05/10/20             PT Long Term Goals - 04/15/20 1815      PT LONG TERM GOAL #1   Title Patient will report at least 60% overall improvement in subjective complaint to indicate improvement in ability to perform ADLs.    Time 6    Period Weeks    Status New    Target Date 05/31/20      PT LONG TERM GOAL #2   Title Patient will be able to ambulate at least 375 feet during 2MWT with LRAD to demonstrate improved ability to perform functional mobility and associated tasks.    Time 6    Period Weeks    Status New    Target Date 05/31/20      PT LONG TERM GOAL #3   Title Patient will be able to perform stand x 5 in < 15 seconds to demonstrate improvement in functional mobility and reduced risk for falls.    Time 6    Period Weeks    Status New    Target Date 05/31/20      PT LONG TERM GOAL #4   Title Patient will be able to maintain tandem stance >30 seconds on BLEs to improve stability and reduce risk for falls    Time 6    Period Weeks    Status New    Target Date 05/31/20                 Plan - 05/09/20 1517    Clinical Impression Statement Added mobility exercises and stretches for pain control.  Continued functional strengthening and balance training.  Pt improving static tandem stance wihtout HHA and improve mechanics with STS though does continues to require cueing for gluteal activation.  EOS reports pain reduced.    Personal Factors and Comorbidities Comorbidity 3+    Comorbidities Seizures, epilepsy, HTN, hypothyroidism, memory impairment, cognitive disorder    Examination-Activity Limitations Stairs;Transfers;Locomotion Level    Examination-Participation Restrictions Laundry;Community Activity;Yard Work    Stability/Clinical Decision Making Stable/Uncomplicated    Designer, jewellery Low    Rehab Potential Fair    PT Frequency 2x / week  PT Duration 6 weeks    PT Treatment/Interventions ADLs/Self Care Home  Management;Aquatic Therapy;Biofeedback;Cryotherapy;Ultrasound;Parrafin;Therapeutic activities;Fluidtherapy;Patient/family education;Manual lymph drainage;Dry needling;Energy conservation;Splinting;Passive range of motion;Scar mobilization;Vasopneumatic Device;Joint Manipulations;Taping;Compression bandaging;Prosthetic Training;Balance training;Therapeutic exercise;Orthotic Fit/Training;Contrast Bath;Electrical Stimulation;DME Instruction;Iontophoresis 4mg /ml Dexamethasone;Gait training;Neuromuscular re-education;Stair training;Moist Heat;Traction;Functional mobility training;Cognitive remediation;Manual techniques;Spinal Manipulations;Vestibular;Visual/perceptual remediation/compensation;Canalith Repostioning    PT Next Visit Plan Progress functional strength and balance as able. Continue work on foam and dynamic baalnce    PT Home Exercise Plan 10/28: heel raises, STS and tandem stance with HHA           Patient will benefit from skilled therapeutic intervention in order to improve the following deficits and impairments:  Abnormal gait, Pain, Decreased balance, Difficulty walking, Decreased strength, Dizziness, Improper body mechanics  Visit Diagnosis: Other abnormalities of gait and mobility  Muscle weakness (generalized)     Problem List Patient Active Problem List   Diagnosis Date Noted  . Renal cysts, acquired, bilateral 06/04/2015  . CKD (chronic kidney disease) stage 3, GFR 30-59 ml/min (HCC) 06/04/2015  . Gross hematuria 06/04/2015   Ihor Austin, LPTA/CLT; CBIS [phone]  Aldona Lento 05/09/2020, 3:41 PM  [place] [address] [place], [place], [address] Phone: [phone]   Fax:  [phone]  Name: Tanganyika Bowlds MRN: [id number] Date of Birth: 1940-10-30

## 2020-05-13 ENCOUNTER — Ambulatory Visit (HOSPITAL_COMMUNITY): Payer: Medicare HMO | Admitting: Physical Therapy

## 2020-05-14 ENCOUNTER — Other Ambulatory Visit: Payer: Self-pay

## 2020-05-14 ENCOUNTER — Telehealth (HOSPITAL_COMMUNITY): Payer: Self-pay | Admitting: Physical Therapy

## 2020-05-14 ENCOUNTER — Encounter (HOSPITAL_COMMUNITY): Payer: Self-pay | Admitting: Physical Therapy

## 2020-05-14 ENCOUNTER — Ambulatory Visit (HOSPITAL_COMMUNITY): Payer: Medicare HMO | Admitting: Physical Therapy

## 2020-05-14 DIAGNOSIS — R2689 Other abnormalities of gait and mobility: Secondary | ICD-10-CM | POA: Diagnosis not present

## 2020-05-14 DIAGNOSIS — M6281 Muscle weakness (generalized): Secondary | ICD-10-CM

## 2020-05-14 NOTE — Telephone Encounter (Signed)
Called patient about missed appointment 05/13/20. No answer, no available voicemailbox.  4:26 PM, 05/14/20 Josue Hector PT DPT  Physical Therapist with [facility]  [phone]

## 2020-05-14 NOTE — Therapy (Signed)
[place], [place], [address] Phone: [phone]   Fax:  [phone]  Physical Therapy Treatment  Patient Details  Name: Diana Kent MRN: [id number] Date of Birth: Sep 14, 1940 Referring Provider (PT): Phillips Odor MD    Encounter Date: 05/14/2020   PT End of Session - 05/14/20 1447    Visit Number 8    Number of Visits 12    Date for PT Re-Evaluation 05/31/20    Authorization Type [employer] Medicare; Medicaid secondary    Authorization Time Period Cohere approved 12 visits from 10/25-->05/31/20    Authorization - Visit Number 8    Authorization - Number of Visits 12    Progress Note Due on Visit 10    PT Start Time 1450    PT Stop Time 1530    PT Time Calculation (min) 40 min    Equipment Utilized During Treatment Gait belt    Activity Tolerance Patient tolerated treatment well    Behavior During Therapy [facility] for tasks assessed/performed           Past Medical History:  Diagnosis Date  . Acid reflux   . Arthritis   . Asthma   . CKD (chronic kidney disease) stage 3, GFR 30-59 ml/min (HCC) 06/04/2015  . Glaucoma   . Gross hematuria 06/04/2015  . Hyperlipidemia   . Hypertension   . Renal cysts, acquired, bilateral 06/04/2015  . Seizure ([place])   . Sleep apnea   . Stroke [facility])     Past Surgical History:  Procedure Laterality Date  . COLONOSCOPY WITH PROPOFOL N/A 08/04/2017   Procedure: COLONOSCOPY WITH PROPOFOL;  Surgeon: Manya Silvas, MD;  Location: [facility] ENDOSCOPY;  Service: Endoscopy;  Laterality: N/A;  . HAND SURGERY    . PARTIAL HYSTERECTOMY      There were no vitals filed for this visit.   Subjective Assessment - 05/14/20 1452    Subjective States she was really sore after last session. Reports her back is hurting a little bit and this has been going on for a few weeks.    Pertinent History Seizures, epilepsy, HTN, hypothyroidism, memory impairment, cognitive disorder    Patient Stated  Goals Better balance    Pain Onset More than a month ago              [facility] PT Assessment - 05/14/20 0001      Assessment   Medical Diagnosis abnormal gait     Referring Provider (PT) Phillips Odor MD                          [facility] Adult PT Treatment/Exercise - 05/14/20 0001      Knee/Hip Exercises: Standing   Forward Step Up Both;1 set;15 reps;Step Height: 6";Hand Hold: 1;2 sets    Other Standing Knee Exercises hip abd standing 2x15 B with 2 hand hold support               Balance Exercises - 05/14/20 0001      Balance Exercises: Standing   Tandem Stance Eyes open   5-20 second holds - multiple attempts - on floor - SBA    Tandem Gait Forward;5 reps;Intermittent upper extremity support   in // bars   Other Standing Exercises lateral stepping at blue line x3 B     Other Standing Exercises Comments backwards stepping in // bars x6 laps one hand support.  PT Short Term Goals - 04/15/20 1345      PT SHORT TERM GOAL #1   Title Patient will be independent with initial HEP and self-management strategies to improve functional outcomes    Time 3    Period Weeks    Status New    Target Date 05/10/20      PT SHORT TERM GOAL #2   Title Patient will be able to perform stand x 5 in < 25 seconds to demonstrate improvement in functional mobility and reduced risk for falls.    Time 3    Period Weeks    Status New    Target Date 05/10/20             PT Long Term Goals - 04/15/20 1815      PT LONG TERM GOAL #1   Title Patient will report at least 60% overall improvement in subjective complaint to indicate improvement in ability to perform ADLs.    Time 6    Period Weeks    Status New    Target Date 05/31/20      PT LONG TERM GOAL #2   Title Patient will be able to ambulate at least 375 feet during 2MWT with LRAD to demonstrate improved ability to perform functional mobility and associated tasks.    Time 6    Period Weeks     Status New    Target Date 05/31/20      PT LONG TERM GOAL #3   Title Patient will be able to perform stand x 5 in < 15 seconds to demonstrate improvement in functional mobility and reduced risk for falls.    Time 6    Period Weeks    Status New    Target Date 05/31/20      PT LONG TERM GOAL #4   Title Patient will be able to maintain tandem stance >30 seconds on BLEs to improve stability and reduce risk for falls    Time 6    Period Weeks    Status New    Target Date 05/31/20                 Plan - 05/14/20 1448    Clinical Impression Statement Continued focus on LE strengthening and balance. cues throughout for form and to unlock knees during exercises. Fatigue noted but reduced symptoms in back noted after exercises. Patient tolerated exercises well but was hesitant to perform backwards stepping, performed in parallel bars and patient demonstrated improved confidence with exercise. Will continue with current POC.    Personal Factors and Comorbidities Comorbidity 3+    Comorbidities Seizures, epilepsy, HTN, hypothyroidism, memory impairment, cognitive disorder    Examination-Activity Limitations Stairs;Transfers;Locomotion Level    Examination-Participation Restrictions Laundry;Community Activity;Yard Work    Stability/Clinical Decision Making Stable/Uncomplicated    Rehab Potential Fair    PT Frequency 2x / week    PT Duration 6 weeks    PT Treatment/Interventions ADLs/Self Care Home Management;Aquatic Therapy;Biofeedback;Cryotherapy;Ultrasound;Parrafin;Therapeutic activities;Fluidtherapy;Patient/family education;Manual lymph drainage;Dry needling;Energy conservation;Splinting;Passive range of motion;Scar mobilization;Vasopneumatic Device;Joint Manipulations;Taping;Compression bandaging;Prosthetic Training;Balance training;Therapeutic exercise;Orthotic Fit/Training;Contrast Bath;Electrical Stimulation;DME Instruction;Iontophoresis 4mg /ml Dexamethasone;Gait training;Neuromuscular  re-education;Stair training;Moist Heat;Traction;Functional mobility training;Cognitive remediation;Manual techniques;Spinal Manipulations;Vestibular;Visual/perceptual remediation/compensation;Canalith Repostioning    PT Next Visit Plan Progress functional strength and balance as able. Continue work on foam and dynamic baalnce    PT Home Exercise Plan 10/28: heel raises, STS and tandem stance with HHA           Patient will benefit from skilled therapeutic intervention in  order to improve the following deficits and impairments:  Abnormal gait, Pain, Decreased balance, Difficulty walking, Decreased strength, Dizziness, Improper body mechanics  Visit Diagnosis: Other abnormalities of gait and mobility  Muscle weakness (generalized)     Problem List Patient Active Problem List   Diagnosis Date Noted  . Renal cysts, acquired, bilateral 06/04/2015  . CKD (chronic kidney disease) stage 3, GFR 30-59 ml/min (HCC) 06/04/2015  . Gross hematuria 06/04/2015   3:49 PM, 05/14/20 Jerene Pitch, DPT Physical Therapy with [facility]  [phone] office  [place] [address] [place], [place], [address] Phone: [phone]   Fax:  [phone]  Name: Corri Delapaz MRN: [id number] Date of Birth: [birth year]

## 2020-05-15 ENCOUNTER — Ambulatory Visit (HOSPITAL_COMMUNITY): Payer: Medicare HMO | Admitting: Physical Therapy

## 2020-05-20 ENCOUNTER — Ambulatory Visit (HOSPITAL_COMMUNITY): Payer: Medicare HMO | Admitting: Physical Therapy

## 2020-05-20 ENCOUNTER — Encounter (INDEPENDENT_AMBULATORY_CARE_PROVIDER_SITE_OTHER): Payer: Self-pay

## 2020-05-20 ENCOUNTER — Other Ambulatory Visit: Payer: Self-pay

## 2020-05-20 ENCOUNTER — Encounter (HOSPITAL_COMMUNITY): Payer: Self-pay | Admitting: Physical Therapy

## 2020-05-20 DIAGNOSIS — R2689 Other abnormalities of gait and mobility: Secondary | ICD-10-CM | POA: Diagnosis not present

## 2020-05-20 DIAGNOSIS — M6281 Muscle weakness (generalized): Secondary | ICD-10-CM

## 2020-05-20 NOTE — Therapy (Signed)
[place], [place], [address] Phone: [phone]   Fax:  [phone]  Physical Therapy Treatment  Patient Details  Name: Diana Kent MRN: [id number] Date of Birth: 08-11-1940 Referring Provider (PT): Phillips Odor MD    Encounter Date: 05/20/2020   PT End of Session - 05/20/20 1533    Visit Number 9    Number of Visits 12    Date for PT Re-Evaluation 05/31/20    Authorization Type [employer] Medicare; Medicaid secondary    Authorization Time Period [employer] approved 12 visits from 10/25-->05/31/20    Authorization - Visit Number 9    Authorization - Number of Visits 12    Progress Note Due on Visit 10    PT Start Time 9983    PT Stop Time 1612    PT Time Calculation (min) 39 min    Equipment Utilized During Treatment Gait belt    Activity Tolerance Patient tolerated treatment well    Behavior During Therapy [facility] for tasks assessed/performed           Past Medical History:  Diagnosis Date  . Acid reflux   . Arthritis   . Asthma   . CKD (chronic kidney disease) stage 3, GFR 30-59 ml/min (HCC) 06/04/2015  . Glaucoma   . Gross hematuria 06/04/2015  . Hyperlipidemia   . Hypertension   . Renal cysts, acquired, bilateral 06/04/2015  . Seizure ([place])   . Sleep apnea   . Stroke [facility])     Past Surgical History:  Procedure Laterality Date  . COLONOSCOPY WITH PROPOFOL N/A 08/04/2017   Procedure: COLONOSCOPY WITH PROPOFOL;  Surgeon: Manya Silvas, MD;  Location: [facility] ENDOSCOPY;  Service: Endoscopy;  Laterality: N/A;  . HAND SURGERY    . PARTIAL HYSTERECTOMY      There were no vitals filed for this visit.   Subjective Assessment - 05/20/20 1534    Subjective Her shoulders are sore and so is her back. Her balance isn't really improving but her strength is maybe getting a  little better. She feel asleep in a chair and slid out of her chair.    Pertinent History Seizures, epilepsy, HTN, hypothyroidism, memory  impairment, cognitive disorder    Patient Stated Goals Better balance    Currently in Pain? No/denies    Pain Onset More than a month ago                             [facility] Adult PT Treatment/Exercise - 05/20/20 0001      Knee/Hip Exercises: Standing   Heel Raises Both;2 sets;10 reps    Forward Step Up Both;2 sets;10 reps;Hand Hold: 2;Step Height: 8"    Other Standing Knee Exercises palof press 2x10 NBOS    Other Standing Knee Exercises hip abd standing 2x15 B with 2 hand hold support; lateral stepping 2x 15 feet                  PT Education - 05/20/20 1540    Education Details Patient educated on HEP, exercise mechanics    Person(s) Educated Patient    Methods Explanation;Demonstration    Comprehension Verbalized understanding;Returned demonstration            PT Short Term Goals - 04/15/20 1345      PT SHORT TERM GOAL #1   Title Patient will be independent with initial HEP and self-management strategies to improve functional outcomes  Time 3    Period Weeks    Status New    Target Date 05/10/20      PT SHORT TERM GOAL #2   Title Patient will be able to perform stand x 5 in < 25 seconds to demonstrate improvement in functional mobility and reduced risk for falls.    Time 3    Period Weeks    Status New    Target Date 05/10/20             PT Long Term Goals - 04/15/20 1815      PT LONG TERM GOAL #1   Title Patient will report at least 60% overall improvement in subjective complaint to indicate improvement in ability to perform ADLs.    Time 6    Period Weeks    Status New    Target Date 05/31/20      PT LONG TERM GOAL #2   Title Patient will be able to ambulate at least 375 feet during 2MWT with LRAD to demonstrate improved ability to perform functional mobility and associated tasks.    Time 6    Period Weeks    Status New    Target Date 05/31/20      PT LONG TERM GOAL #3   Title Patient will be able to perform stand x 5 in  < 15 seconds to demonstrate improvement in functional mobility and reduced risk for falls.    Time 6    Period Weeks    Status New    Target Date 05/31/20      PT LONG TERM GOAL #4   Title Patient will be able to maintain tandem stance >30 seconds on BLEs to improve stability and reduce risk for falls    Time 6    Period Weeks    Status New    Target Date 05/31/20                 Plan - 05/20/20 1534    Clinical Impression Statement Patient completes heel raises on incline today for increased ankle excursion. She is able to progress to increased height on step up today because she states she continues to have difficulty navigating her basement steps which are larger than steps being performed in prior session. Added palof with NBOS for additional balance and strength training but patient requires cueing for mechanics frequently while completing. Patient completes lateral stepping very slowly with min/mod unsteadiness. Patient will continue to benefit from skilled physical therapy in order to reduce impairment and improve function.    Personal Factors and Comorbidities Comorbidity 3+    Comorbidities Seizures, epilepsy, HTN, hypothyroidism, memory impairment, cognitive disorder    Examination-Activity Limitations Stairs;Transfers;Locomotion Level    Examination-Participation Restrictions Laundry;Community Activity;Yard Work    Stability/Clinical Decision Making Stable/Uncomplicated    Rehab Potential Fair    PT Frequency 2x / week    PT Duration 6 weeks    PT Treatment/Interventions ADLs/Self Care Home Management;Aquatic Therapy;Biofeedback;Cryotherapy;Ultrasound;Parrafin;Therapeutic activities;Fluidtherapy;Patient/family education;Manual lymph drainage;Dry needling;Energy conservation;Splinting;Passive range of motion;Scar mobilization;Vasopneumatic Device;Joint Manipulations;Taping;Compression bandaging;Prosthetic Training;Balance training;Therapeutic exercise;Orthotic  Fit/Training;Contrast Bath;Electrical Stimulation;DME Instruction;Iontophoresis 4mg /ml Dexamethasone;Gait training;Neuromuscular re-education;Stair training;Moist Heat;Traction;Functional mobility training;Cognitive remediation;Manual techniques;Spinal Manipulations;Vestibular;Visual/perceptual remediation/compensation;Canalith Repostioning    PT Next Visit Plan Progress functional strength and balance as able. Continue work on foam and dynamic baalnce    PT Home Exercise Plan 10/28: heel raises, STS and tandem stance with HHA           Patient will benefit from skilled therapeutic intervention in order  to improve the following deficits and impairments:  Abnormal gait, Pain, Decreased balance, Difficulty walking, Decreased strength, Dizziness, Improper body mechanics  Visit Diagnosis: Other abnormalities of gait and mobility  Muscle weakness (generalized)     Problem List Patient Active Problem List   Diagnosis Date Noted  . Renal cysts, acquired, bilateral 06/04/2015  . CKD (chronic kidney disease) stage 3, GFR 30-59 ml/min (HCC) 06/04/2015  . Gross hematuria 06/04/2015    4:17 PM, 05/20/20 Mearl Latin PT, DPT Physical Therapist at [place], [place], [address] Phone: [phone]   Fax:  [phone]  Name: Diana Kent MRN: [id number] Date of Birth: 01-17-1941

## 2020-05-21 ENCOUNTER — Ambulatory Visit (HOSPITAL_COMMUNITY): Payer: Medicare HMO

## 2020-05-23 ENCOUNTER — Ambulatory Visit (HOSPITAL_COMMUNITY): Payer: Medicare HMO | Attending: Neurology

## 2020-05-23 ENCOUNTER — Encounter (HOSPITAL_COMMUNITY): Payer: Self-pay

## 2020-05-23 ENCOUNTER — Other Ambulatory Visit: Payer: Self-pay

## 2020-05-23 DIAGNOSIS — R2689 Other abnormalities of gait and mobility: Secondary | ICD-10-CM | POA: Insufficient documentation

## 2020-05-23 DIAGNOSIS — M6281 Muscle weakness (generalized): Secondary | ICD-10-CM | POA: Insufficient documentation

## 2020-05-23 NOTE — Therapy (Addendum)
[place] [address] [place], [place], [address] Phone: [phone]   Fax:  [phone]  Physical Therapy Treatment   Patient Details  Name: Diana Kent MRN: [id number] Date of Birth: 04/29/1941 Referring Provider (PT): Phillips Odor MD   Progress Note Reporting Period 04/15/20 to 05/23/20  See note below for Objective Data and Assessment of Progress/Goals.      Encounter Date: 05/23/2020   PT End of Session - 05/23/20 1528    Visit Number 10    Number of Visits 12    Date for PT Re-Evaluation 05/31/20    Authorization Type [employer] Medicare; Medicaid secondary    Authorization Time Period Cohere approved 12 visits from 10/25-->05/31/20    Authorization - Visit Number 10    Authorization - Number of Visits 12    Progress Note Due on Visit 10    PT Start Time 1524    PT Stop Time 1608    PT Time Calculation (min) 44 min    Equipment Utilized During Treatment Gait belt    Activity Tolerance Patient tolerated treatment well;Patient limited by pain    Behavior During Therapy WFL for tasks assessed/performed           Past Medical History:  Diagnosis Date  . Acid reflux   . Arthritis   . Asthma   . CKD (chronic kidney disease) stage 3, GFR 30-59 ml/min (HCC) 06/04/2015  . Glaucoma   . Gross hematuria 06/04/2015  . Hyperlipidemia   . Hypertension   . Renal cysts, acquired, bilateral 06/04/2015  . Seizure ([place])   . Sleep apnea   . Stroke [facility])     Past Surgical History:  Procedure Laterality Date  . COLONOSCOPY WITH PROPOFOL N/A 08/04/2017   Procedure: COLONOSCOPY WITH PROPOFOL;  Surgeon: Manya Silvas, MD;  Location: [facility] ENDOSCOPY;  Service: Endoscopy;  Laterality: N/A;  . HAND SURGERY    . PARTIAL HYSTERECTOMY      There were no vitals filed for this visit.   Subjective Assessment - 05/23/20 1527    Subjective Pt stated she doesn't feel her balance has improved, feels she has made slight improvements  with therapy.    Pertinent History Seizures, epilepsy, HTN, hypothyroidism, memory impairment, cognitive disorder    Patient Stated Goals Better balance    Currently in Pain? Yes    Pain Score 7     Pain Location Knee    Pain Orientation Left    Pain Descriptors / Indicators Aching    Pain Type Chronic pain    Pain Onset More than a month ago    Pain Frequency Intermittent    Aggravating Factors  walking, standing, WB    Pain Relieving Factors sitting, rest    Effect of Pain on Daily Activities limits              [facility] PT Assessment - 05/23/20 0001      Assessment   Medical Diagnosis abnormal gait     Referring Provider (PT) Phillips Odor MD       Precautions   Precautions Fall      Cognition   Overall Cognitive Status Impaired/Different from baseline (P)       Transfers   Five time sit to stand comments  05/23/20:  2 sets cueing required for purpose of testing 1st set:44", 2nd sets 21.60 when completed with therapist   was 32 sec with UEs      Ambulation/Gait   Ambulation/Gait Yes  Ambulation/Gait Assistance 5: Supervision    Ambulation Distance (Feet) 322 Feet   was 315 2MWT   Assistive device None    Gait Pattern Decreased arm swing - right;Decreased arm swing - left;Decreased step length - right;Decreased step length - left;Decreased stride length;Decreased dorsiflexion - right;Decreased dorsiflexion - left    Ambulation Surface Level;Indoor    Gait Comments 2MWT      Balance   Balance Assessed Yes      Static Standing Balance   Static Standing Balance -  Activities  Tandam Stance - Right Leg;Tandam Stance - Left Leg    Static Standing - Comment/# of Minutes Lt 8" Rt 12"   was 8sec, 6 sec                        [facility] Adult PT Treatment/Exercise - 05/23/20 0001      Ambulation/Gait   Stairs Yes    Stairs Assistance 5: Supervision    Stair Management Technique One rail Left;Alternating pattern    Number of Stairs 4   3 sets   Height of  Stairs 7               Balance Exercises - 05/23/20 0001      Balance Exercises: Standing   Tandem Stance Eyes open;5 reps;30 secs;Intermittent upper extremity support   able to hold partial tandem stance 30" without HHA   Sidestepping 2 reps;Theraband   RTB around thigh   Marching 10 reps   2 sets toe tapping 6in step height               PT Short Term Goals - 05/23/20 1529      PT SHORT TERM GOAL #1   Title Patient will be independent with initial HEP and self-management strategies to improve functional outcomes    Baseline 05/23/20:  Reports compliance wiht HEP daily verbalized STS and heel raises    Status Achieved      PT SHORT TERM GOAL #2   Title Patient will be able to perform stand x 5 in < 25 seconds to demonstrate improvement in functional mobility and reduced risk for falls.    Baseline 05/23/20:  2 sets cueing required for purpose of testing 1st set:44", 2nd sets 21.60 when completed with therapist    Status Partially Met             PT Long Term Goals - 05/23/20 1533      PT LONG TERM GOAL #1   Title Patient will report at least 60% overall improvement in subjective complaint to indicate improvement in ability to perform ADLs.    Baseline 05/23/20:  Reports overall improvements 25%    Status On-going      PT LONG TERM GOAL #2   Title Patient will be able to ambulate at least 375 feet during 2MWT with LRAD to demonstrate improved ability to perform functional mobility and associated tasks.    Baseline 05/23/20: 2MWT 377f with SPC    Status On-going      PT LONG TERM GOAL #3   Title Patient will be able to perform stand x 5 in < 15 seconds to demonstrate improvement in functional mobility and reduced risk for falls.    Baseline 05/23/20:  2 sets cueing required for purpose of testing 1st set:44", 2nd sets 21.60 when completed with therapist    Status On-going      PT LONG TERM GOAL #4   Title Patient  will be able to maintain tandem stance >30  seconds on BLEs to improve stability and reduce risk for falls    Baseline 05/23/20: Lt weight bearing 8", Rt WB 12"; Able to complete BLE partial tandem stance 30" without HHA or LOB    Status On-going                 Plan - 05/23/20 1611    Clinical Impression Statement Reviewed goals with 1 STG met and progressing toward LTGs.  Pt presents with some improvement with increase speed during 5STS and increased distance during 2MWT.  Balance continues to be impaired with risk of fall.  Pt educated on proper hand use with cane to address Lt knee pain.  Added balance activities to HEP with printout given to pt, verbalized understanding.    Personal Factors and Comorbidities Comorbidity 3+    Comorbidities Seizures, epilepsy, HTN, hypothyroidism, memory impairment, cognitive disorder    Examination-Activity Limitations Stairs;Transfers;Locomotion Level    Examination-Participation Restrictions Laundry;Community Activity;Yard Work    Merchant [employer] officer Stable/Uncomplicated    Designer, jewellery Low    Rehab Potential Fair    PT Frequency 2x / week    PT Duration 6 weeks    PT Treatment/Interventions ADLs/Self Care Home Management;Aquatic Therapy;Biofeedback;Cryotherapy;Ultrasound;Parrafin;Therapeutic activities;Fluidtherapy;Patient/family education;Manual lymph drainage;Dry needling;Energy conservation;Splinting;Passive range of motion;Scar mobilization;Vasopneumatic Device;Joint Manipulations;Taping;Compression bandaging;Prosthetic Training;Balance training;Therapeutic exercise;Orthotic Fit/Training;Contrast Bath;Electrical Stimulation;DME Instruction;Iontophoresis 11m/ml Dexamethasone;Gait training;Neuromuscular re-education;Stair training;Moist Heat;Traction;Functional mobility training;Cognitive remediation;Manual techniques;Spinal Manipulations;Vestibular;Visual/perceptual remediation/compensation;Canalith Repostioning    PT Next Visit Plan Focus on balance and  established advanced HEP safely.    PT Home Exercise Plan 10/28: heel raises, STS and tandem stance with HHA; 05/23/20: tandem           Patient will benefit from skilled therapeutic intervention in order to improve the following deficits and impairments:  Abnormal gait, Pain, Decreased balance, Difficulty walking, Decreased strength, Dizziness, Improper body mechanics  Visit Diagnosis: Other abnormalities of gait and mobility  Muscle weakness (generalized)     Problem List Patient Active Problem List   Diagnosis Date Noted  . Renal cysts, acquired, bilateral 06/04/2015  . CKD (chronic kidney disease) stage 3, GFR 30-59 ml/min (HCC) 06/04/2015  . Gross hematuria 06/04/2015   CIhor Austin LPTA/CLT; CBIS [phone] CAldona Lento12/07/2019, 4:28 PM  6:17 PM, 05/23/20 CJosue HectorPT DPT  [facility] [phone] [address]Phone: [phone]  Fax:  [phone] Name: GTacha ManniMRN: [id number] of Birth: 4September 09, 1942

## 2020-05-23 NOTE — Patient Instructions (Signed)
Tandem Stance    Standing close to kitchen or bathroom counter. Place right foot in front of left, heel touching toe both feet "straight ahead". Stand on Foot Triangle of Support with both feet.  Balance in this position 30 seconds. Do with left foot in front of right.  Copyright  VHI. All rights reserved.

## 2020-05-27 ENCOUNTER — Other Ambulatory Visit: Payer: Self-pay

## 2020-05-27 ENCOUNTER — Encounter (HOSPITAL_COMMUNITY): Payer: Self-pay | Admitting: Physical Therapy

## 2020-05-27 ENCOUNTER — Ambulatory Visit (HOSPITAL_COMMUNITY): Payer: Medicare HMO | Admitting: Physical Therapy

## 2020-05-27 DIAGNOSIS — R2689 Other abnormalities of gait and mobility: Secondary | ICD-10-CM

## 2020-05-27 DIAGNOSIS — M6281 Muscle weakness (generalized): Secondary | ICD-10-CM

## 2020-05-27 NOTE — Therapy (Signed)
[facility] [address] [place], [place], [address] Phone: [phone]   Fax:  [phone]  Physical Therapy Treatment  Patient Details  Name: Diana Kent MRN: [id number] Date of Birth: 12-26-1940 Referring Provider (PT): Phillips Odor MD   PHYSICAL THERAPY DISCHARGE SUMMARY  Visits from Start of Care: 11  Current functional level related to goals / functional outcomes: See below    Remaining deficits: See below    Education / Equipment: See assessement Plan: Patient agrees to discharge.  Patient goals were not met. Patient is being discharged due to lack of progress.  ?????      Encounter Date: 05/27/2020   PT End of Session - 05/27/20 1533    Visit Number 11    Number of Visits 12    Date for PT Re-Evaluation 05/31/20    Authorization Type [employer] Medicare; Medicaid secondary    Authorization Time Period [place] approved 12 visits from 10/25-->05/31/20    Authorization - Visit Number 11    Authorization - Number of Visits 12    Progress Note Due on Visit 10    PT Start Time 1520    PT Stop Time 1600    PT Time Calculation (min) 40 min    Equipment Utilized During Treatment Gait belt    Activity Tolerance Patient tolerated treatment well    Behavior During Therapy WFL for tasks assessed/performed           Past Medical History:  Diagnosis Date   Acid reflux    Arthritis    Asthma    CKD (chronic kidney disease) stage 3, GFR 30-59 ml/min (HCC) 06/04/2015   Glaucoma    Gross hematuria 06/04/2015   Hyperlipidemia    Hypertension    Renal cysts, acquired, bilateral 06/04/2015   Seizure ([facility])    Sleep apnea    Stroke [facility])     Past Surgical History:  Procedure Laterality Date   COLONOSCOPY WITH PROPOFOL N/A 08/04/2017   Procedure: COLONOSCOPY WITH PROPOFOL;  Surgeon: Manya Silvas, MD;  Location: [facility] ENDOSCOPY;  Service: Endoscopy;  Laterality: N/A;   HAND SURGERY     PARTIAL  HYSTERECTOMY      There were no vitals filed for this visit.   Subjective Assessment - 05/27/20 1532    Subjective Pateitn says she doesnt feel any different. She says she can "feel" that her balance is no better. Says her back is hurting today    Pertinent History Seizures, epilepsy, HTN, hypothyroidism, memory impairment, cognitive disorder    Patient Stated Goals Better balance    Currently in Pain? Yes    Pain Score 9     Pain Location Back    Pain Orientation Posterior;Lower    Pain Onset More than a month ago              [facility] PT Assessment - 05/27/20 0001      Assessment   Medical Diagnosis abnormal gait     Referring Provider (PT) Phillips Odor MD       Precautions   Precautions Fall      Cognition   Overall Cognitive Status Impaired/Different from baseline      Transfers   Five time sit to stand comments  21 sec with no UEs      Ambulation/Gait   Ambulation/Gait Yes    Ambulation/Gait Assistance 6: Modified independent (Device/Increase time)    Ambulation Distance (Feet) 335 Feet    Assistive device None  Gait Pattern Decreased arm swing - right;Decreased arm swing - left;Decreased step length - right;Decreased step length - left;Decreased stride length;Decreased dorsiflexion - right;Decreased dorsiflexion - left    Gait Comments 2MWT      Balance   Balance Assessed Yes      Static Standing Balance   Static Standing Balance -  Activities  Tandam Stance - Right Leg;Tandam Stance - Left Leg    Static Standing - Comment/# of Minutes LT 12 RT 15 (in semi tandem)                                    PT Short Term Goals - 05/27/20 1631      PT SHORT TERM GOAL #1   Title Patient will be independent with initial HEP and self-management strategies to improve functional outcomes    Baseline Reports compliance    Status Achieved      PT SHORT TERM GOAL #2   Title Patient will be able to perform stand x 5 in < 25 seconds to  demonstrate improvement in functional mobility and reduced risk for falls.    Baseline Current 21 sec with no UE    Status Achieved             PT Long Term Goals - 05/27/20 1632      PT LONG TERM GOAL #1   Title Patient will report at least 60% overall improvement in subjective complaint to indicate improvement in ability to perform ADLs.    Baseline Current reports 50%    Status Not Met      PT LONG TERM GOAL #2   Title Patient will be able to ambulate at least 375 feet during 2MWT with LRAD to demonstrate improved ability to perform functional mobility and associated tasks.    Baseline Current 335 with no AD    Status Not Met      PT LONG TERM GOAL #3   Title Patient will be able to perform stand x 5 in < 15 seconds to demonstrate improvement in functional mobility and reduced risk for falls.    Baseline Current 21 sec with no UE    Status Not Met      PT LONG TERM GOAL #4   Title Patient will be able to maintain tandem stance >30 seconds on BLEs to improve stability and reduce risk for falls    Baseline Current LT 12 sec RT 15 sec in semi tandem (loses balance posteriorly)    Status Not Met                 Plan - 05/27/20 1623    Clinical Impression Statement Patient shows little change in recent progress. Patient shows some improvement in gait speed, but most prominent improvement in in transfer ability and functional strength with sit to stands and stair ambulation. Patient continues to be limited by static balance deficits. Patient demos tendency to shift weight and lose balance posteriorly when practicing semi tandem stance. Patient states she feels her balance has not improved overall since starting therapy and that she would like today to be her last visit. Patient educated on continued use of AD for long walks and uneven surfaces to improve balance and safety. Educated patient on and issued updated HEP handout. Patient instructed to follow up with therapy services  with any further questions or concerns.    Personal Factors and Comorbidities Comorbidity  3+    Comorbidities Seizures, epilepsy, HTN, hypothyroidism, memory impairment, cognitive disorder    Examination-Activity Limitations Stairs;Transfers;Locomotion Level    Examination-Participation Restrictions Laundry;Community Activity;Yard Work    Stability/Clinical Decision Making Stable/Uncomplicated    Rehab Potential Fair    PT Frequency 2x / week    PT Duration 6 weeks    PT Treatment/Interventions ADLs/Self Care Home Management;Aquatic Therapy;Biofeedback;Cryotherapy;Ultrasound;Parrafin;Therapeutic activities;Fluidtherapy;Patient/family education;Manual lymph drainage;Dry needling;Energy conservation;Splinting;Passive range of motion;Scar mobilization;Vasopneumatic Device;Joint Manipulations;Taping;Compression bandaging;Prosthetic Training;Balance training;Therapeutic exercise;Orthotic Fit/Training;Contrast Bath;Electrical Stimulation;DME Instruction;Iontophoresis 27m/ml Dexamethasone;Gait training;Neuromuscular re-education;Stair training;Moist Heat;Traction;Functional mobility training;Cognitive remediation;Manual techniques;Spinal Manipulations;Vestibular;Visual/perceptual remediation/compensation;Canalith Repostioning    PT Next Visit Plan DC to HEP    PT Home Exercise Plan 10/28: heel raises, STS and tandem stance with HHA; 05/23/20: tandem    Consulted and Agree with Plan of Care Patient           Patient will benefit from skilled therapeutic intervention in order to improve the following deficits and impairments:  Abnormal gait, Pain, Decreased balance, Difficulty walking, Decreased strength, Dizziness, Improper body mechanics  Visit Diagnosis: Other abnormalities of gait and mobility  Muscle weakness (generalized)     Problem List Patient Active Problem List   Diagnosis Date Noted   Renal cysts, acquired, bilateral 06/04/2015   CKD (chronic kidney disease) stage 3, GFR 30-59  ml/min (HCC) 06/04/2015   Gross hematuria 06/04/2015   4:36 PM, 05/27/20 CJosue HectorPT DPT  [facility] [phone] [address]Phone: [phone]  Fax:  [phone] Name: GTanetta FuhrimanMRN: [id number] of Birth: 426-Mar-1942

## 2020-05-29 ENCOUNTER — Ambulatory Visit (HOSPITAL_COMMUNITY): Payer: Medicare HMO | Admitting: Physical Therapy

## 2020-10-02 ENCOUNTER — Other Ambulatory Visit (HOSPITAL_COMMUNITY): Payer: Self-pay | Admitting: Family Medicine

## 2020-10-02 DIAGNOSIS — Z1231 Encounter for screening mammogram for malignant neoplasm of breast: Secondary | ICD-10-CM

## 2020-10-22 ENCOUNTER — Other Ambulatory Visit (HOSPITAL_COMMUNITY): Payer: Self-pay | Admitting: Nephrology

## 2020-10-22 ENCOUNTER — Other Ambulatory Visit: Payer: Self-pay | Admitting: Nephrology

## 2020-10-22 DIAGNOSIS — E876 Hypokalemia: Secondary | ICD-10-CM

## 2020-10-22 DIAGNOSIS — R319 Hematuria, unspecified: Secondary | ICD-10-CM

## 2020-10-22 DIAGNOSIS — R809 Proteinuria, unspecified: Secondary | ICD-10-CM

## 2020-10-22 DIAGNOSIS — N1832 Chronic kidney disease, stage 3b: Secondary | ICD-10-CM

## 2020-10-22 DIAGNOSIS — I129 Hypertensive chronic kidney disease with stage 1 through stage 4 chronic kidney disease, or unspecified chronic kidney disease: Secondary | ICD-10-CM

## 2020-10-30 ENCOUNTER — Other Ambulatory Visit: Payer: Self-pay

## 2020-10-30 ENCOUNTER — Encounter: Payer: Self-pay | Admitting: Nurse Practitioner

## 2020-10-30 ENCOUNTER — Other Ambulatory Visit (HOSPITAL_COMMUNITY): Payer: Self-pay | Admitting: Family Medicine

## 2020-10-30 ENCOUNTER — Other Ambulatory Visit: Payer: Self-pay | Admitting: Nurse Practitioner

## 2020-10-30 DIAGNOSIS — G8929 Other chronic pain: Secondary | ICD-10-CM

## 2020-10-30 DIAGNOSIS — Z515 Encounter for palliative care: Secondary | ICD-10-CM

## 2020-10-30 DIAGNOSIS — Z13820 Encounter for screening for osteoporosis: Secondary | ICD-10-CM

## 2020-10-30 NOTE — Progress Notes (Signed)
[facility] Palliative Care Consult Note Telephone: [phone]  Fax: [phone]    Date of encounter: 10/30/20 PATIENT NAME: Diana Kent [address] [place] [address]   [phone] (home)  DOB: September 19, 1940 MRN: [id number] PRIMARY CARE PROVIDER:    Alfonse Flavors, MD,  [address] [place] [address] [phone]  RESPONSIBLE PARTY:    Contact Information    Name Relation Home Work Mobile   [place] Daughter [phone]     Shamirah, Ivan [phone]       I met face to face with patient office. Palliative Care was asked to follow this patient by consultation request of  Zhou-Talbert, Elwyn Lade,* to address advance care planning and complex medical decision making. This is the initial palliative visit  ASSESSMENT AND PLAN / RECOMMENDATIONS:  Symptom Management/Plan: 1. ACP on going discussions, will further discuss code status at next visit  2. Chronic pain likely secondary to arthritis per Ms. Bricco as waiting for records for any imaging completed. Discussed at length Ms. Birnie takes a lot of medications daily as she does not like to add another oral agent. We talked about tylenol. Ms. Crumble endorses she will take a tylenol if severe pain. We talked about pain modality. We talked about exercises. We talked about option of using aspercream as topical analgesic. Ms. Vankuren endorses she will try to see if improvement, will f/u 4 weeks per Ms. Mickler request, give her time to see if it works for her.  3. Palliative care encounter; Palliative care encounter; Palliative medicine team will continue to support patient, patient's family, and medical team. Visit consisted of counseling and education dealing with the complex and emotionally intense issues of symptom management and palliative care in the setting of serious and potentially life-threatening illness  Follow up Palliative Care Visit: Palliative care will  continue to follow for complex medical decision making, advance care planning, and clarification of goals. Return 4 weeks or prn.  I spent 45 minutes providing this consultation. More than 50% of the time in this consultation was spent in counseling and care coordination.  PPS: 60%  HOSPICE ELIGIBILITY/DIAGNOSIS: TBD  Chief Complaint: Palliative initial consult for complex medical decision making  HISTORY OF PRESENT ILLNESS:  [name] is a 80 y.o. year old female  with Stage 3b chronic kidney disease, renal mass, proteinuria, HTN, Cerebral artery occlusion with cerebral infarction, GERD, HLD, sleep apnea, hematuria, anemia, secondary hyperparathyroidism, vitamin D deficiency. Seen by [facility] 10/16/2020 Stage 3b chronic kidney disease, renal mass, proteinuria, hematuria. Current weight 184 lbs with BMI 20.25. Plan is to request another MRI to evaluate renal mass, last documented workup done 2016/2017, request labs, bp stable, return 4 weeks. I saw Ms. Hopkins in face to face office visit. Ms. Messinger and I talked about purpose of PC visit. Ms. Dominique in agreement. We talked about how she was feeling today. Ms. Pinnix endorses she is doing okay. We talked about past medical history. We talked about CKD with recent nephrology visit. We talked about chronic disease progression. We talked about medications and how she takes them. Ms Mcnall endorses sometimes she forgets to take her levothyroxine. Ms. Ator recent has placed her medications in a weekly medication box which has been very helpful. Ms. Feng endorses this has helped her to remember her medications. We talked about life review. Ms. 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 We talked about life review. Ms. Reisz is widowed of 61+ years, has a daughter and son. Ms. Anselmo lives with her son. Ms. Schumm endorses her son help  her with cooking meals, they alternate. Ms. Aydt endorses he helps to take care of her. Ms. Kotowski endorses she did drive herself to the appointment today but usually has someone bring her as her driving is limited. We talked about HCPOA which is in place with her son/daughter being her POA if she is not able to answer for herself. We talked about disease progression in the setting of natural aging. We talked about functional changes including walking with a can. No recent falls. We talked about at this time Ms. Rosamond can bath and dress herself. We talked about when the time comes and she is not able to bath and dress herself if she has thought about it. Ms. Mahnken endorses she has begun to think what would happen to her, how to protect her home, assets as well as if she needs more skilled care. We talked about planning for future care as this initial pc visit can help with further discussions and planning. We talked about PC SW calling with resources for further planning such as an elder attorney. Ms. King endorses she has been having more trouble with her memory, forgetfulness. Ms Spirito endorses she has not been lost, she is concerned of the changes she and her son notice. We talked about symptoms including appetite. Ms. Weidmann endorses "it is too good". We talked about weight, nutrition at this time in her life. We talked about symptoms of pain which she does experience in her lower back. Ms. Silvestri endorses she believes it is arthritis, does not recall imaging. Discussed at length Ms. Pulice takes a lot of medications daily as she does not like to add another oral agent. We talked about tylenol. Ms. Leedy endorses she will take a tylenol if severe pain. We talked about pain modality. We talked about exercises. We talked about option of using aspercream as topical analgesic. We talked about medical goals of care. We talked about quality of life looking ahead on what she would like her life to be like,  things she would like to do, accomplish. Ms Coonan reflected on the things she use to enjoy that she is no longer able to do such as work in her flowers. Ms. Sakamoto endorses she will try to see if improvement, will f/u 4 weeks per Ms. Kober request, give her time to see if it works for her. Appointment scheduled. Will further discuss with Ms. Daisey if okay if involve her daughter and son in next PC visit for discussions.   History obtained from review of EMR, discussion with primary team, and interview with family, facility staff/caregiver and/or Ms. Minerd.   I reviewed available labs, medications, imaging, studies and related documents from the EMR.  Records reviewed and summarized above.   ROS Full 14 system review of systems performed and negative with exception of: as per HPI.  Physical Exam: General- pt is awake,alert, oriented to time place and person HEENT- Head is a atraumatic, normocephalic Resp- No acute respiratory distress, Clear to auscultation CVS- S1S2 regular in rate and rhythm CNS- CN 2-12 grossly intact. Moving all 4 extremities; ambulates with cane Psych- normal mood and affect Skin- Warm and dry  SOCIAL HX:  Social History   Tobacco Use  . Smoking status: Former Smoker  Quit date: 06/03/1990    Years since quitting: 30.4  . Smokeless tobacco: Never Used  Substance Use Topics  . Alcohol use: No    Alcohol/week: 0.0 standard drinks   FAMILY HX:  Family History  Problem Relation Age of Onset  . Bone cancer Mother   . Hypertension Father   . Diabetes Father   . CVA Father   . Prostate cancer Neg Hx   . Kidney cancer Neg Hx    reviewed  ALLERGIES:  Allergies  Allergen Reactions  . Latex   . Penicillins      PERTINENT MEDICATIONS:  Outpatient Encounter Medications as of 10/30/2020  Medication Sig  . ALPHAGAN P 0.1 % SOLN   . dipyridamole-aspirin (AGGRENOX) 200-25 MG 12hr capsule   . donepezil (ARICEPT) 10 MG tablet   . lamoTRIgine (LAMICTAL) 100  MG tablet   . levothyroxine (SYNTHROID, LEVOTHROID) 75 MCG tablet   . losartan (COZAAR) 50 MG tablet   . lovastatin (MEVACOR) 40 MG tablet   . metoprolol succinate (TOPROL-XL) 100 MG 24 hr tablet Take 100 mg by mouth daily. Take with or immediately following a meal.  . sertraline (ZOLOFT) 25 MG tablet Take 25 mg by mouth daily.   No facility-administered encounter medications on file as of 10/30/2020.   Questions and concerns were addressed. The patient was encouraged to call with questions and/or concerns. My contact information was provided. Provided general support and encouragement, no other unmet needs identified  Thank you for the opportunity to participate in the care of Ms. Mainville.  The palliative care team will continue to follow. Please call our office at [phone] if we can be of additional assistance.   This chart was dictated using voice recognition software. Despite best efforts to proofread, errors can occur which can change the documentation meaning.  Shreeya Recendiz Ihor Gully, NP , MSN,, [facility]

## 2020-10-31 ENCOUNTER — Telehealth: Payer: Self-pay

## 2020-10-31 NOTE — Telephone Encounter (Signed)
10/31/20 @530  pm: Palliative care SW outreached patient, per [facility] NP - C. Gusler, request to discuss resources for an elder attorney, how to protect her assets, planning for long term.  Call unsuccessful. SW unable to LVM. SW will attempt to outreach at later date.

## 2020-11-04 ENCOUNTER — Telehealth: Payer: Self-pay

## 2020-11-04 NOTE — Telephone Encounter (Signed)
11/04/20 @930  Am: Palliative care SW outreached patient, per PC NP - C. Gusler, request to discuss resources for an elder attorney, how to protect her assets, planning for long term.  Call unsuccessful. SW unable to LVM.  PC NP can share the following info/resource with patient at next visit:  [place], advance care planning and asset protection. First consultation is free. [phone]

## 2020-11-07 ENCOUNTER — Ambulatory Visit (HOSPITAL_COMMUNITY)
Admission: RE | Admit: 2020-11-07 | Discharge: 2020-11-07 | Disposition: A | Discharge status: Home / Self Care | Payer: Medicare HMO | Source: Ambulatory Visit | Attending: Nephrology | Admitting: Nephrology

## 2020-11-07 DIAGNOSIS — I129 Hypertensive chronic kidney disease with stage 1 through stage 4 chronic kidney disease, or unspecified chronic kidney disease: Secondary | ICD-10-CM | POA: Insufficient documentation

## 2020-11-07 DIAGNOSIS — R809 Proteinuria, unspecified: Secondary | ICD-10-CM | POA: Diagnosis present

## 2020-11-07 DIAGNOSIS — N1832 Chronic kidney disease, stage 3b: Secondary | ICD-10-CM | POA: Insufficient documentation

## 2020-11-07 DIAGNOSIS — E876 Hypokalemia: Secondary | ICD-10-CM | POA: Diagnosis present

## 2020-11-07 DIAGNOSIS — R319 Hematuria, unspecified: Secondary | ICD-10-CM | POA: Insufficient documentation

## 2020-11-21 ENCOUNTER — Ambulatory Visit (HOSPITAL_COMMUNITY)
Admission: RE | Admit: 2020-11-21 | Discharge: 2020-11-21 | Disposition: A | Discharge status: Home / Self Care | Payer: Medicare HMO | Source: Ambulatory Visit | Attending: Family Medicine | Admitting: Family Medicine

## 2020-11-21 DIAGNOSIS — Z1231 Encounter for screening mammogram for malignant neoplasm of breast: Secondary | ICD-10-CM | POA: Diagnosis present

## 2020-11-27 ENCOUNTER — Other Ambulatory Visit: Payer: Self-pay

## 2020-11-27 ENCOUNTER — Other Ambulatory Visit: Payer: Medicaid Other | Admitting: Nurse Practitioner

## 2020-11-27 ENCOUNTER — Encounter: Payer: Self-pay | Admitting: Nurse Practitioner

## 2020-11-27 DIAGNOSIS — Z515 Encounter for palliative care: Secondary | ICD-10-CM

## 2020-11-27 DIAGNOSIS — G8929 Other chronic pain: Secondary | ICD-10-CM

## 2020-11-27 NOTE — Progress Notes (Signed)
  [facility] Consult Note Telephone: [phone]  Fax: [phone]    Date of encounter: 11/27/20 PATIENT NAME: Diana Kent [address] [place] [address]   [phone] (home)  DOB: 09/28/1940 MRN: [id number] PRIMARY CARE PROVIDER:    Zhou-Talbert, Diana S, MD,  [address] [place] [address] [phone]  RESPONSIBLE PARTY:    Contact Information    Name Relation Home Work Mobile   Kent,Diana Daughter [phone]     Kent,Diana Son [phone]       I met face to face with patient in palliative clinic. Palliative Care was asked to follow this patient by consultation request of  Kent, Diana S,* to address advance care planning and complex medical decision making. This is a follow up visit.  ASSESSMENT AND PLAN / RECOMMENDATIONS:   Advance Care Planning/Goals of Care: Goals include to maximize quality of life and symptom management. Our advance care planning conversation included a discussion about:     The value and importance of advance care planning   Experiences with loved ones who have been seriously ill or have died   Exploration of personal, cultural or spiritual beliefs that might influence medical decisions   Exploration of goals of care in the event of a sudden injury or illness   Identification and preparation of a healthcare agent   Review and updating or creation of an  advance directive document .  Decision not to resuscitate or to de-escalate disease focused treatments due to poor prognosis.  CODE STATUS: DNR  Symptom Management/Plan: 1. ACP: Ms. Chernick wishes to be DNR, golden rod form completed placed in vynca. Ms. Gish endorses she will share with her adult children. Offerred to further discuss or any questions with Ms. Winegarden with her adult children. Ms. Schecter endorses she makes her decisions and will update her son/daughter  2. Chronic pain likely secondary to  arthritis per Ms. Kagel improving with Aspercreme to lower back. Discussed pain regiment, management. We talked about option of physical therapy for pain modality, Ms. Brandle endorses she will think about it  3.Palliative care encounter; Palliative care encounter; Palliative medicine team will continue to support patient, patient's family, and medical team. Visit consisted of counseling and education dealing with the complex and emotionally intense issues of symptom management and palliative care in the setting of serious and potentially life-threatening illness  Follow up Palliative Care Visit: Palliative care will continue to follow for complex medical decision making, advance care planning, and clarification of goals. Return 12 weeks or prn.  I spent 45 minutes providing this consultation. More than 50% of the time in this consultation was spent in counseling and care coordination.  PPS: 60%  HOSPICE ELIGIBILITY/DIAGNOSIS: TBD  Chief Complaint:Palliative follow up for complex medical decision making   HISTORY OF PRESENT ILLNESS:  Diana Kent is a 80 y.o. year old female  with Stage 3b chronic kidney disease, renal mass, proteinuria, HTN, Cerebral artery occlusion with cerebral infarction, GERD, HLD, sleep apnea, hematuria, anemia, secondary hyperparathyroidism, vitamin D deficiency. Seen by [facility] 10/16/2020 Stage 3b chronic kidney disease, renal mass, proteinuria, hematuria. Ms Mcelvain came into the palliative care clinic for follow up for lower back pain, symptom management, chronic disease progression, ongoing discussions complex medical decision making. Ms. Odeh and I  talked about purpose of palliative care visit. We talked about symptoms of lower back pain. Ms Torian endorses she has been using the asper cream and has helped   quite a bit with her pain. We talked about with the frequency she has been using it. Ms Dunwoody endorses she does not use it unless the  pain is severe and it seems to minimize when applied. Ms Monnin talked about not using the asper cream everyday. Offered to give Ms Malphrus a prescription for asper cream but she prefers to continue to get it over the counter. We talked about physical therapy for pain modality. Ms Kohrs endorses she will think about therapy. Ms Charnley endorses she has previously had physical therapy and feels like it it did not help all that much. She was receiving therapy for strengthening. Ms Matthew continues to walk with a cane. No new functional changes for adls as she continues to be able to bath and dress herself. Ms Choma endorses her son does do most of the cooking and appetite continues to be good.no changes in clothes size. We talked about chronic disease progression in the setting of natural aging. We talked her nephrology appointment, reviewed notes. We talked about chronic kidney disease. We talked about limitations such as refraining from sodas, modifying salt intake. We talked about the function of the kidneys in the body. We talked about the cysts that were noted and nephrology recommendations. We talked about her memory. Ms Poss endorses that it just seems like she is getting a little more forgetful. Ms Standing endorses her son does not want for her to drive. We talked about the concern of safety. We talked about challenges surrounding slow loss of independence. We talked about the challenges surrounding adult children helping with more decision making. Ms Sizer endorses she continues to be able to make decisions for herself and she tells her adult children that as well. We talked about elder attorney palliative care social worker was trying to contact to provide information. Contact information provided for elder attorneyfor state planning per Ms Blazejewski's request. We talked about medical goals of care period we talkedabout aggressive versus conservative versus comfort care what those look like. We talked about  medical decision making. We talked about healthcare power of attorney. Ms Schaad endorses she would want her adult children to be making decisions for her if she was not able to make them herself. We talked about healthcare power of attorney papers. We talked about code status in scenarios of CPR. Ms Cumpian endorses she would not want to have CPR and wishes to be a do not resuscitate. Ask Ms Stennis if she would like to further discuss with her children prior to completing the goldenrod form. Ms. Freeburg replied she would like the form to be completed and she will further discuss with herdaughter and son. Discussed with Ms Medal should they have any questions or she changes her mind to please contact and can further discuss any answer questions as needed. We talked about role of pallative care and plan of care period we talked about follow up palliative care visit in three months if needed or sooner should she decline. Ms Caccavale in agreement, appointment scheduled. Encourage Ms Jocson to bring her son the next visit. Encouraged Ms Lortie to continue to have open conversations with her adult children about her wishes for her care. Therapeutic listening, emotional support provided. Questions answered to satisfaction. Ms Cajuste endorses her son brought her to the appointment and will pick her up as well.   History obtained from review of EMR, discussion with  Ms. Steuber.  I reviewed available labs, medications, imaging, studies and related documents   from the EMR.  Records reviewed and summarized above.   ROS  Full 14 system review of systems performed and negative with exception of: as per HPI.  Physical Exam: Constitutional: NAD General: pleasant elderly female EYES: lids intact ENMT: oral mucous membranes moist CV: S1S2, RRR,  Pulmonary: LCTA,  room air Abdomen: soft and non tender, MSK:ambulatory with cane Skin: warm and dry, no rashes or wounds on visible skin Neuro:  + generalized weakness,  Some  difficulty with recall cognitive impairment Psych: non-anxious affect, A and O x 3  Questions and concerns were addressed. The patientwas encouraged to call with questions and/or concerns. My business card was provided. Provided general support and encouragement, no other unmet needs identified  Thank you for the opportunity to participate in the care of Ms. Bonenfant.  The palliative care team will continue to follow. Please call our office at [phone] if we can be of additional assistance.   This chart was dictated using voice recognition software. Despite best efforts to proofread, errors can occur which can change the documentation meaning.   Christin Z Gusler, NP , MSN,, A[facility]PN   

## 2021-06-02 IMAGING — MG MM DIGITAL SCREENING BILAT W/ TOMO AND CAD
6 of 10 series · 6 of 30 positions shown · non-contrast
Comparison: Previous exam(s).

CLINICAL DATA: Screening.

EXAM:
DIGITAL SCREENING BILATERAL MAMMOGRAM WITH TOMOSYNTHESIS AND CAD
TECHNIQUE: Bilateral screening digital craniocaudal and mediolateral oblique
mammograms were obtained. Bilateral screening digital breast
tomosynthesis was performed. The images were evaluated with
computer-aided detection.

[L MLO synth-2D (1 of 2)]
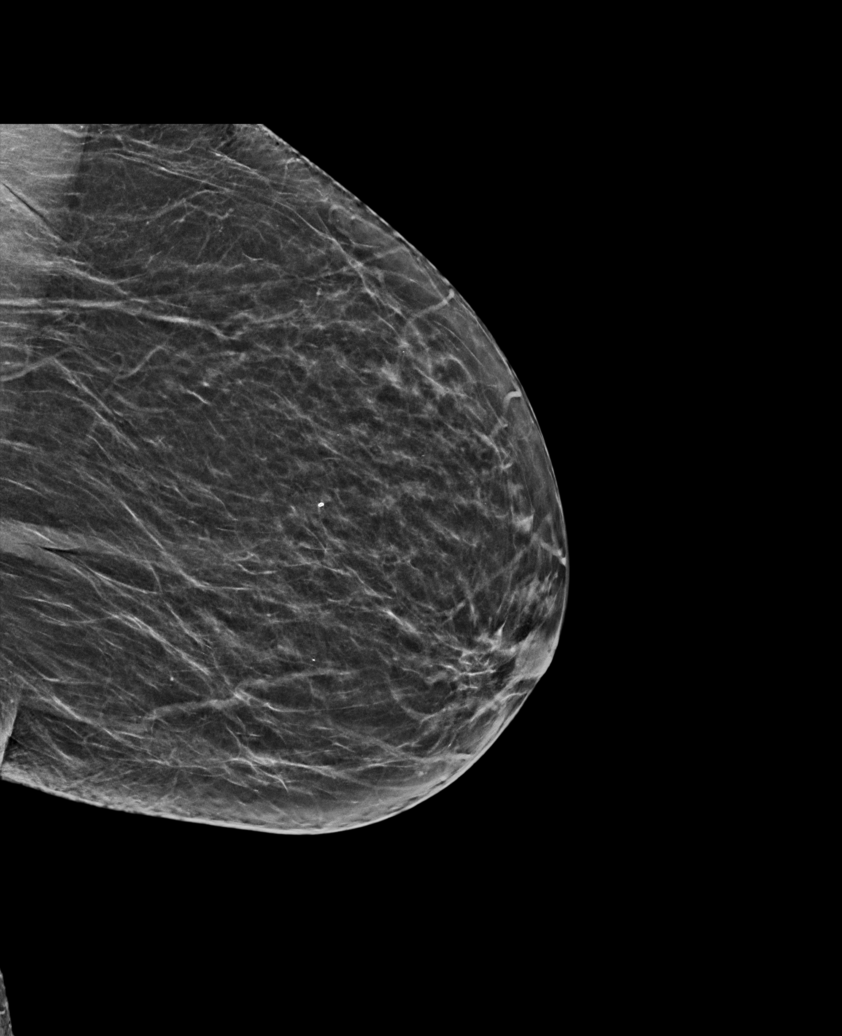

[R CC synth-2D]
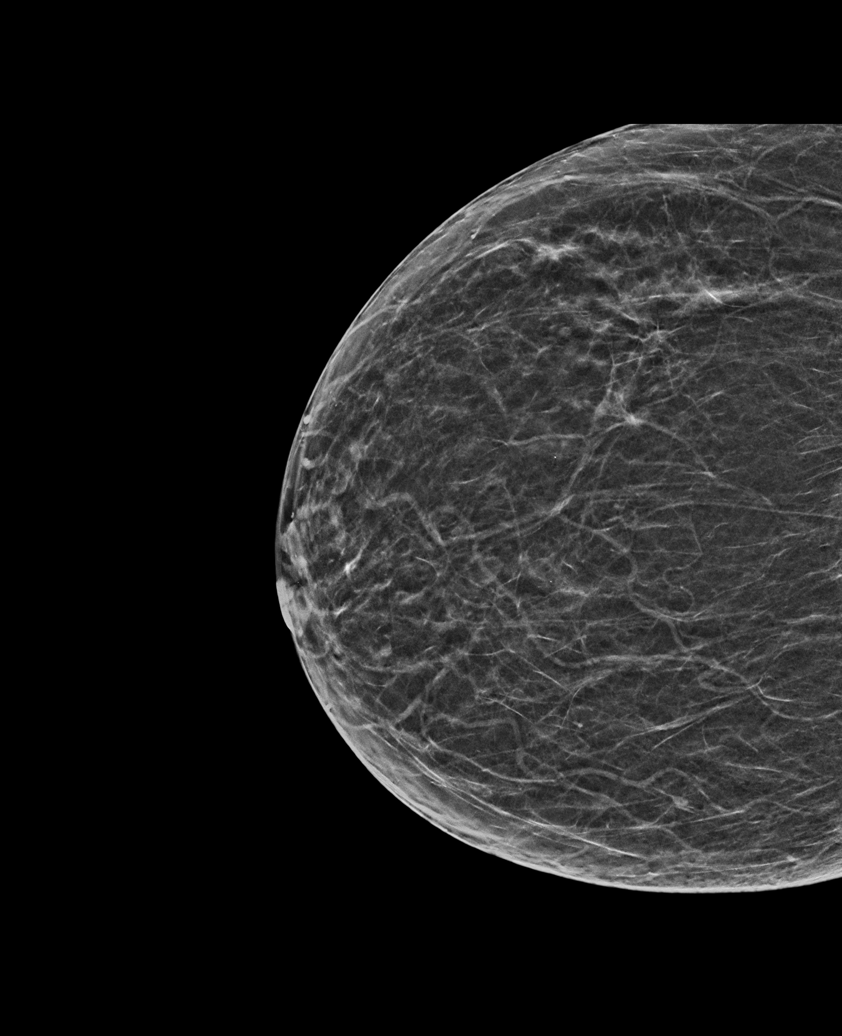

[R MLO synth-2D]
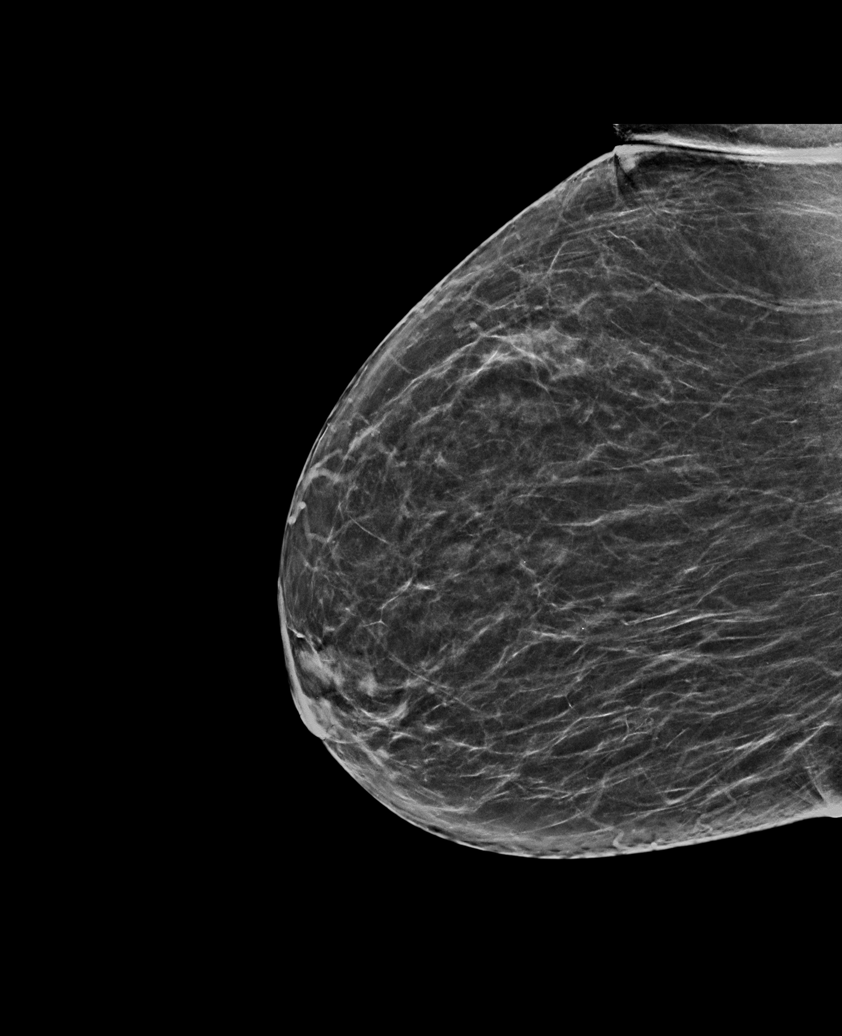

[L CC synth-2D]
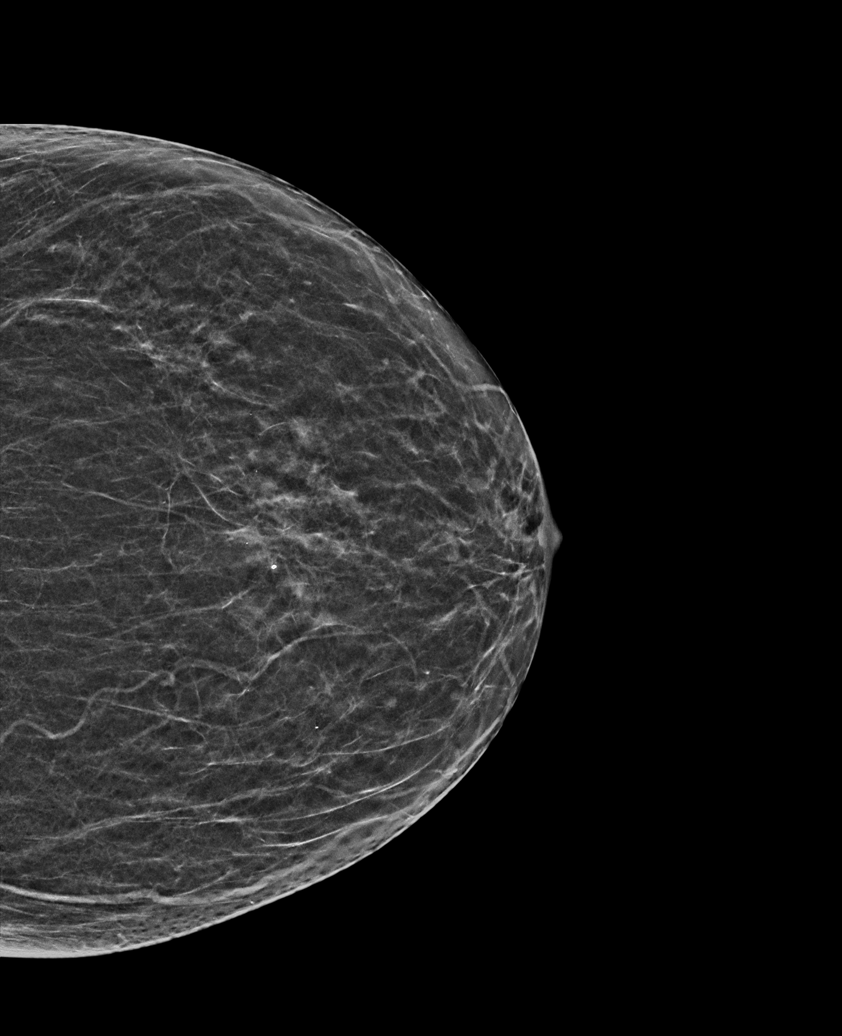

[L MLO synth-2D (2 of 2)]
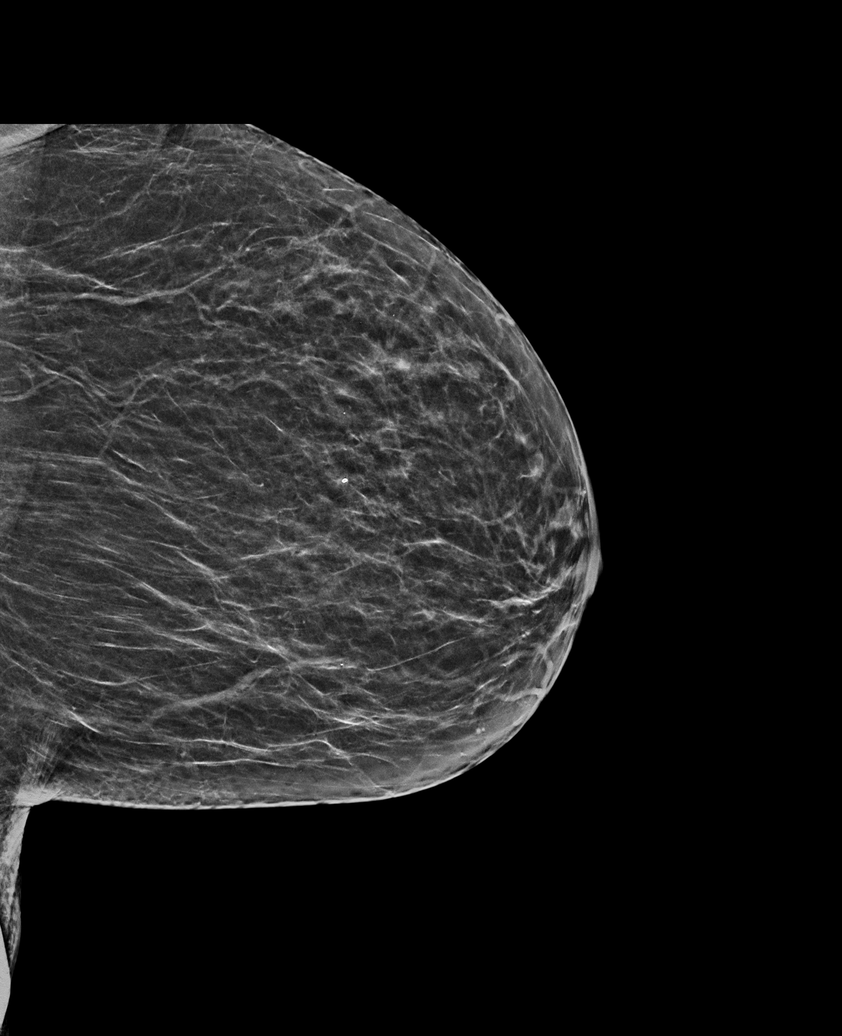

[R CC tomo · tomo slice 26/51.0]
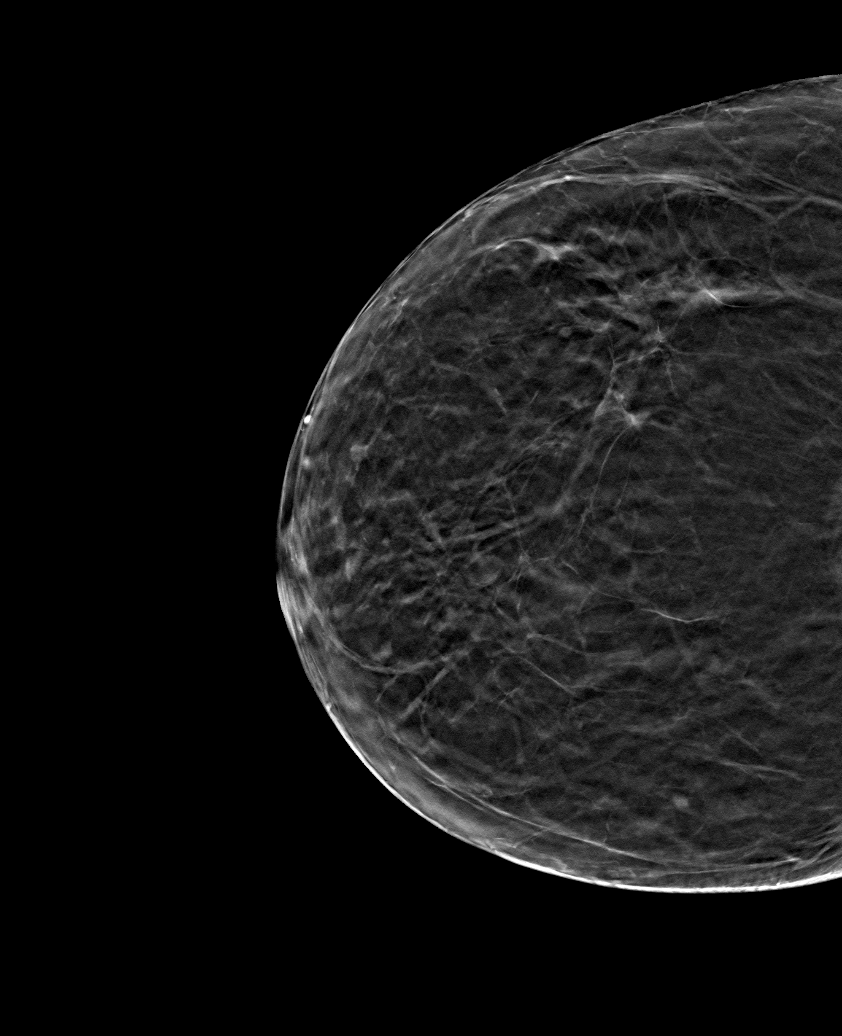

[6 of 30 positions shown; findings below may reference images not displayed]

ACR Breast Density Category b: There are scattered areas of
fibroglandular density.
FINDINGS: There are no findings suspicious for malignancy. The images were
evaluated with computer-aided detection.
IMPRESSION: No mammographic evidence of malignancy. A result letter of this
screening mammogram will be mailed directly to the patient.

RECOMMENDATION:
Screening mammogram in one year. (Code:WJ-I-BG6)

BI-RADS CATEGORY  1: Negative.

## 2021-07-10 ENCOUNTER — Other Ambulatory Visit (HOSPITAL_COMMUNITY): Payer: Self-pay | Admitting: Family Medicine

## 2021-07-10 DIAGNOSIS — Z13820 Encounter for screening for osteoporosis: Secondary | ICD-10-CM

## 2021-08-04 ENCOUNTER — Other Ambulatory Visit (HOSPITAL_COMMUNITY): Payer: Medicare HMO

## 2021-09-03 ENCOUNTER — Telehealth: Payer: Self-pay | Admitting: Nurse Practitioner

## 2021-09-03 ENCOUNTER — Telehealth: Payer: Self-pay

## 2021-09-03 ENCOUNTER — Other Ambulatory Visit: Payer: Self-pay

## 2021-09-03 ENCOUNTER — Other Ambulatory Visit: Payer: Medicare HMO | Admitting: Nurse Practitioner

## 2021-09-03 NOTE — Telephone Encounter (Signed)
I called Diana Kent for f/u pc visit. Ms. Espe endorses she would like to reschedule, discussed with son. Will send day available to schedule when Ms. Canto is able to check her calendar, Mr. Haggart requested it to be emailed to him. Will do per instructed.  ?

## 2021-09-03 NOTE — Telephone Encounter (Signed)
Spoke with patient and scheduled a telephonic Palliative Care follow up for 09/03/21 @ 12 PM.  ?

## 2021-09-19 ENCOUNTER — Other Ambulatory Visit (HOSPITAL_COMMUNITY): Payer: Self-pay | Admitting: Nephrology

## 2021-09-19 ENCOUNTER — Other Ambulatory Visit: Payer: Self-pay | Admitting: Nephrology

## 2021-09-19 DIAGNOSIS — N1832 Chronic kidney disease, stage 3b: Secondary | ICD-10-CM

## 2021-09-19 DIAGNOSIS — I129 Hypertensive chronic kidney disease with stage 1 through stage 4 chronic kidney disease, or unspecified chronic kidney disease: Secondary | ICD-10-CM

## 2021-09-19 DIAGNOSIS — R7303 Prediabetes: Secondary | ICD-10-CM

## 2021-09-24 ENCOUNTER — Ambulatory Visit (HOSPITAL_COMMUNITY)
Admission: RE | Admit: 2021-09-24 | Discharge: 2021-09-24 | Disposition: A | Discharge status: Home / Self Care | Payer: Medicare HMO | Source: Ambulatory Visit | Attending: Nephrology | Admitting: Nephrology

## 2021-09-24 DIAGNOSIS — N1832 Chronic kidney disease, stage 3b: Secondary | ICD-10-CM | POA: Insufficient documentation

## 2021-09-24 DIAGNOSIS — R7303 Prediabetes: Secondary | ICD-10-CM | POA: Insufficient documentation

## 2021-09-24 DIAGNOSIS — I129 Hypertensive chronic kidney disease with stage 1 through stage 4 chronic kidney disease, or unspecified chronic kidney disease: Secondary | ICD-10-CM | POA: Insufficient documentation

## 2021-10-02 ENCOUNTER — Encounter: Payer: Self-pay | Admitting: *Deleted

## 2021-10-03 ENCOUNTER — Ambulatory Visit: Payer: Medicare HMO | Admitting: Certified Registered Nurse Anesthetist

## 2021-10-03 ENCOUNTER — Encounter: Payer: Self-pay | Admitting: *Deleted

## 2021-10-03 ENCOUNTER — Ambulatory Visit
Admission: RE | Admit: 2021-10-03 | Discharge: 2021-10-03 | Disposition: A | Discharge status: Home / Self Care | Payer: Medicare HMO | Attending: Gastroenterology | Admitting: Gastroenterology

## 2021-10-03 ENCOUNTER — Encounter
Admission: RE | Disposition: A | Discharge status: Home / Self Care | Payer: Self-pay | Source: Home / Self Care | Attending: Gastroenterology

## 2021-10-03 DIAGNOSIS — K635 Polyp of colon: Secondary | ICD-10-CM | POA: Diagnosis not present

## 2021-10-03 DIAGNOSIS — K640 First degree hemorrhoids: Secondary | ICD-10-CM | POA: Diagnosis not present

## 2021-10-03 DIAGNOSIS — Z8673 Personal history of transient ischemic attack (TIA), and cerebral infarction without residual deficits: Secondary | ICD-10-CM | POA: Insufficient documentation

## 2021-10-03 DIAGNOSIS — E039 Hypothyroidism, unspecified: Secondary | ICD-10-CM | POA: Diagnosis not present

## 2021-10-03 DIAGNOSIS — N183 Chronic kidney disease, stage 3 unspecified: Secondary | ICD-10-CM | POA: Diagnosis not present

## 2021-10-03 DIAGNOSIS — R194 Change in bowel habit: Secondary | ICD-10-CM | POA: Insufficient documentation

## 2021-10-03 DIAGNOSIS — K625 Hemorrhage of anus and rectum: Secondary | ICD-10-CM | POA: Insufficient documentation

## 2021-10-03 DIAGNOSIS — I129 Hypertensive chronic kidney disease with stage 1 through stage 4 chronic kidney disease, or unspecified chronic kidney disease: Secondary | ICD-10-CM | POA: Insufficient documentation

## 2021-10-03 DIAGNOSIS — K5730 Diverticulosis of large intestine without perforation or abscess without bleeding: Secondary | ICD-10-CM | POA: Diagnosis not present

## 2021-10-03 HISTORY — PX: COLONOSCOPY WITH PROPOFOL: SHX5780

## 2021-10-03 SURGERY — COLONOSCOPY WITH PROPOFOL
Anesthesia: General

## 2021-10-03 MED ORDER — LIDOCAINE HCL (CARDIAC) PF 100 MG/5ML IV SOSY
PREFILLED_SYRINGE | INTRAVENOUS | Status: DC | PRN
  Administered 2021-10-03: 50 mg via INTRAVENOUS

## 2021-10-03 MED ORDER — PROPOFOL 500 MG/50ML IV EMUL
INTRAVENOUS | Status: DC | PRN
Start: 2021-10-03 — End: 2021-10-03
  Administered 2021-10-03: 140 ug/kg/min via INTRAVENOUS

## 2021-10-03 MED ORDER — PROPOFOL 10 MG/ML IV BOLUS
INTRAVENOUS | Status: DC | PRN
  Administered 2021-10-03: 70 mg via INTRAVENOUS

## 2021-10-03 MED ORDER — SODIUM CHLORIDE 0.9 % IV SOLN: INTRAVENOUS | Status: DC

## 2021-10-03 NOTE — Transfer of Care (Signed)
Immediate Anesthesia Transfer of Care Note ? ?Patient: Diana Kent ? ?Procedure(s) Performed: COLONOSCOPY WITH PROPOFOL ? ?Patient Location: PACU ? ?Anesthesia Type:General ? ?Level of Consciousness: sedated ? ?Airway & Oxygen Therapy: Patient Spontanous Breathing ? ?Post-op Assessment: Report given to RN and Post -op Vital signs reviewed and stable ? ?Post vital signs: Reviewed and stable ? ?Last Vitals:  ?Vitals Value Taken Time  ?BP 95/61 10/03/21 1121  ?Temp 36.4 ?C 10/03/21 1118  ?Pulse 64 10/03/21 1121  ?Resp 18 10/03/21 1121  ?SpO2 92 % 10/03/21 1121  ? ? ?Last Pain:  ?Vitals:  ? 10/03/21 1118  ?TempSrc: Temporal  ?PainSc: Asleep  ?   ? ?  ? ?Complications: No notable events documented. ?

## 2021-10-03 NOTE — Anesthesia Postprocedure Evaluation (Signed)
Anesthesia Post Note ? ?Patient: Diana Kent ? ?Procedure(s) Performed: COLONOSCOPY WITH PROPOFOL ? ?Patient location during evaluation: PACU ?Anesthesia Type: General ?Level of consciousness: awake and oriented ?Pain management: pain level controlled ?Vital Signs Assessment: post-procedure vital signs reviewed and stable ?Respiratory status: spontaneous breathing and respiratory function stable ?Cardiovascular status: stable ?Anesthetic complications: no ? ? ?No notable events documented. ? ? ?Last Vitals:  ?Vitals:  ? 10/03/21 1121 10/03/21 1128  ?BP: 95/61 101/70  ?Pulse: 64   ?Resp: 18   ?Temp:    ?SpO2: 92%   ?  ?Last Pain:  ?Vitals:  ? 10/03/21 1138  ?TempSrc:   ?PainSc: 0-No pain  ? ? ?  ?  ?  ?  ?  ?  ? ?VAN STAVEREN,[name] ? ? ? ? ?

## 2021-10-03 NOTE — H&P (Signed)
Outpatient short stay form Pre-procedure ?10/03/2021  ?Diana Rubenstein, MD ? ?Primary Physician: Alfonse Flavors, MD ? ?Reason for visit:  Rectal bleeding/Change in bowel habits ? ?History of present illness:   ? ?Diana Kent with history of hypothyroidism and hypertension here for colonoscopy due to rectal bleeding/change in bowel habits. Takes aggrenox with last dose 6 days ago. History of hysterectomy. No family history of GI malignancies. Last colonoscopy was in 2019 with 5 Ta's. ? ? ? ?Current Facility-Administered Medications:  ?  0.9 %  sodium chloride infusion, , Intravenous, Continuous, Raimi Guillermo, Hilton Cork, MD, Last Rate: 20 mL/hr at 10/03/21 1044, New Bag at 10/03/21 1044 ? ?Medications Prior to Admission  ?Medication Sig Dispense Refill Last Dose  ? calcium citrate (CALCITRATE - DOSED IN MG ELEMENTAL CALCIUM) 950 (200 Ca) MG tablet Take 200 mg of elemental calcium by mouth daily.   Past Week  ? donepezil (ARICEPT) 10 MG tablet    10/02/2021  ? lamoTRIgine (LAMICTAL) 100 MG tablet    10/02/2021  ? levothyroxine (SYNTHROID, LEVOTHROID) 75 MCG tablet    10/03/2021 at 0610  ? losartan (COZAAR) 50 MG tablet    10/03/2021 at 0610  ? lovastatin (MEVACOR) 40 MG tablet    10/02/2021  ? metoprolol succinate (TOPROL-XL) 100 MG 24 hr tablet Take 100 mg by mouth daily. Take with or immediately following a meal.   10/03/2021 at 0610  ? oxybutynin (DITROPAN) 5 MG tablet Take 5 mg by mouth 3 (three) times daily.   10/02/2021  ? sertraline (ZOLOFT) 25 MG tablet Take 25 mg by mouth daily.   10/02/2021  ? ALPHAGAN P 0.1 % SOLN      ? dipyridamole-aspirin (AGGRENOX) 200-25 MG 12hr capsule    09/25/2021  ? ? ? ?Allergies  ?Allergen Reactions  ? Latex   ? Penicillins   ? ? ? ?Past Medical History:  ?Diagnosis Date  ? Acid reflux   ? Arthritis   ? Asthma   ? CKD (chronic kidney disease) stage 3, GFR 30-59 ml/min (HCC) 06/04/2015  ? Glaucoma   ? Gross hematuria 06/04/2015  ? Hyperlipidemia   ? Hypertension   ? Renal cysts,  acquired, bilateral 06/04/2015  ? Seizure ([place])   ? Sleep apnea   ? Stroke [facility])   ? ? ?Review of systems:  Otherwise negative.  ? ? ?Physical Exam ? ?Gen: Alert, oriented. Appears stated age.  ?HEENT: PERRLA. ?Lungs: No respiratory distress ?CV: RRR ?Abd: soft, benign, no masses ?Ext: No edema ? ? ? ?Planned procedures: Proceed with colonoscopy. The patient understands the nature of the planned procedure, indications, risks, alternatives and potential complications including but not limited to bleeding, infection, perforation, damage to internal organs and possible oversedation/side effects from anesthesia. The patient agrees and gives consent to proceed.  ?Please refer to procedure notes for findings, recommendations and patient disposition/instructions.  ? ? ? ?Diana Rubenstein, MD ?[facility] ? ? ? ?  ? ?

## 2021-10-03 NOTE — Anesthesia Procedure Notes (Signed)
Date/Time: 10/03/2021 10:55 AM ?Performed by: Johnna Acosta, CRNA ?Pre-anesthesia Checklist: Patient identified, Emergency Drugs available, Suction available, Patient being monitored and Timeout performed ?Patient Re-evaluated:Patient Re-evaluated prior to induction ?Oxygen Delivery Method: Nasal cannula ?Preoxygenation: Pre-oxygenation with 100% oxygen ?Induction Type: IV induction ? ? ? ? ?

## 2021-10-03 NOTE — Anesthesia Preprocedure Evaluation (Signed)
Anesthesia Evaluation  ?Patient identified by MRN, date of birth, ID band ?Patient awake ? ? ? ?Reviewed: ?Allergy & Precautions, NPO status , Patient's Chart, lab work & pertinent test results ? ?Airway ?Mallampati: II ? ?TM Distance: >3 FB ?Neck ROM: Full ? ? ? Dental ? ?(+) Partial Upper, Partial Lower ?  ?Pulmonary ?neg pulmonary ROS, asthma , former smoker,  ?  ?Pulmonary exam normal ? ?+ decreased breath sounds ? ? ? ? ? Cardiovascular ?hypertension, Pt. on medications ?negative cardio ROS ?Normal cardiovascular exam ?Rhythm:Regular Rate:Normal ? ? ?  ?Neuro/Psych ?Seizures -,  CVA negative neurological ROS ? negative psych ROS  ? GI/Hepatic ?negative GI ROS, Neg liver ROS, GERD  ,  ?Endo/Other  ?negative endocrine ROS ? Renal/GU ?negative Renal ROS  ?negative genitourinary ?  ?Musculoskeletal ? ? Abdominal ?Normal abdominal exam  (+)   ?Peds ?negative pediatric ROS ?(+)  Hematology ?negative hematology ROS ?(+)   ?Anesthesia Other Findings ?Past Medical History: ?No date: Acid reflux ?No date: Arthritis ?No date: Asthma ?06/04/2015: CKD (chronic kidney disease) stage 3, GFR 30-59 ml/min  ?(HCC) ?No date: Glaucoma ?06/04/2015: Gross hematuria ?No date: Hyperlipidemia ?No date: Hypertension ?06/04/2015: Renal cysts, acquired, bilateral ?No date: Seizure ([place]) ?No date: Sleep apnea ?No date: Stroke [facility]) ? ?Past Surgical History: ?No date: ABDOMINAL HYSTERECTOMY ?08/04/2017: COLONOSCOPY WITH PROPOFOL; N/A ?    Comment:  Procedure: COLONOSCOPY WITH PROPOFOL;  Surgeon: Vira Agar, ?             Gavin Pound, MD;  Location: [facility] ENDOSCOPY;  Service:  ?             Endoscopy;  Laterality: N/A; ?No date: HAND SURGERY ?No date: PARTIAL HYSTERECTOMY ? ?BMI   ? Body Mass Index: 28.25 kg/m?  ?  ? ? Reproductive/Obstetrics ?negative OB ROS ? ?  ? ? ? ? ? ? ? ? ? ? ? ? ? ?  ?  ? ? ? ? ? ? ? ? ?Anesthesia Physical ?Anesthesia Plan ? ?ASA: 3 ? ?Anesthesia Plan: General  ? ?Post-op Pain Management:    ? ?Induction: Intravenous ? ?PONV Risk Score and Plan: Propofol infusion and TIVA ? ?Airway Management Planned: Natural Airway and Nasal Cannula ? ?Additional Equipment:  ? ?Intra-op Plan:  ? ?Post-operative Plan:  ? ?Informed Consent: I have reviewed the patients History and Physical, chart, labs and discussed the procedure including the risks, benefits and alternatives for the proposed anesthesia with the patient or authorized representative who has indicated his/her understanding and acceptance.  ? ? ? ?Dental Advisory Given ? ?Plan Discussed with: CRNA and Surgeon ? ?Anesthesia Plan Comments:   ? ? ? ? ? ? ?Anesthesia Quick Evaluation ? ?

## 2021-10-03 NOTE — Interval H&P Note (Signed)
History and Physical Interval Note: ? ?10/03/2021 ?10:53 AM ? ?Diana Kent  has presented today for surgery, with the diagnosis of HX of adenomatous polyp of colon.  The various methods of treatment have been discussed with the patient and family. After consideration of risks, benefits and other options for treatment, the patient has consented to  Procedure(s): ?COLONOSCOPY WITH PROPOFOL (N/A) as a surgical intervention.  The patient's history has been reviewed, patient examined, no change in status, stable for surgery.  I have reviewed the patient's chart and labs.  Questions were answered to the patient's satisfaction.   ? ? ?Hilton Cork Mearle Drew ? ?Ok to proceed with colonoscopy ?

## 2021-10-03 NOTE — Op Note (Signed)
[facility] ?Gastroenterology ?Patient Name: Magally Vahle ?Procedure Date: 10/03/2021 10:45 AM ?MRN: [id number] ?Account #: [id number] ?Date of Birth: 03-13-1941 ?Admit Type: Outpatient ?Age: 81 ?Room: [facility] ENDO ROOM 3 ?Gender: Female ?Note Status: Finalized ?Instrument Name: Colonoscope [id number] ?Procedure:             Colonoscopy ?Indications:           Rectal bleeding, Change in bowel habits ?Providers:             Andrey Farmer MD, MD ?Medicines:             Monitored Anesthesia Care ?Complications:         No immediate complications. Estimated blood loss:  ?                       Minimal. ?Procedure:             Pre-Anesthesia Assessment: ?                       - Prior to the procedure, a History and Physical was  ?                       performed, and patient medications and allergies were  ?                       reviewed. The patient is competent. The risks and  ?                       benefits of the procedure and the sedation options and  ?                       risks were discussed with the patient. All questions  ?                       were answered and informed consent was obtained.  ?                       Patient identification and proposed procedure were  ?                       verified by the physician, the nurse, the  ?                       anesthesiologist, the anesthetist and the technician  ?                       in the endoscopy suite. Mental Status Examination:  ?                       alert and oriented. Airway Examination: normal  ?                       oropharyngeal airway and neck mobility. Respiratory  ?                       Examination: clear to auscultation. CV Examination:  ?                       normal. Prophylactic Antibiotics: The patient does not  ?  require prophylactic antibiotics. Prior  ?                       Anticoagulants: The patient has taken antiplatelet  ?                       medication, last dose was 6 days prior to  procedure.  ?                       ASA Grade Assessment: II - A patient with mild  ?                       systemic disease. After reviewing the risks and  ?                       benefits, the patient was deemed in satisfactory  ?                       condition to undergo the procedure. The anesthesia  ?                       plan was to use monitored anesthesia care (MAC).  ?                       Immediately prior to administration of medications,  ?                       the patient was re-assessed for adequacy to receive  ?                       sedatives. The heart rate, respiratory rate, oxygen  ?                       saturations, blood pressure, adequacy of pulmonary  ?                       ventilation, and response to care were monitored  ?                       throughout the procedure. The physical status of the  ?                       patient was re-assessed after the procedure. ?                       After obtaining informed consent, the colonoscope was  ?                       passed under direct vision. Throughout the procedure,  ?                       the patient's blood pressure, pulse, and oxygen  ?                       saturations were monitored continuously. The  ?                       Colonoscope was introduced through the anus and  ?  advanced to the the cecum, identified by appendiceal  ?                       orifice and ileocecal valve. The colonoscopy was  ?                       performed without difficulty. The patient tolerated  ?                       the procedure well. The quality of the bowel  ?                       preparation was adequate to identify polyps. ?Findings: ?     The perianal and digital rectal examinations were normal. ?     A 2 mm polyp was found in the transverse colon. The polyp was sessile.  ?     The polyp was removed with a jumbo cold forceps. Resection and retrieval  ?     were complete. Estimated blood loss was minimal. ?      Multiple small and large-mouthed diverticula were found in the sigmoid  ?     colon and descending colon. ?     Internal hemorrhoids were found during retroflexion. The hemorrhoids  ?     were Grade I (internal hemorrhoids that do not prolapse). ?     The exam was otherwise without abnormality on direct and retroflexion  ?     views. ?Impression:            - One 2 mm polyp in the transverse colon, removed with  ?                       a jumbo cold forceps. Resected and retrieved. ?                       - Diverticulosis in the sigmoid colon and in the  ?                       descending colon. ?                       - Internal hemorrhoids. ?                       - The examination was otherwise normal on direct and  ?                       retroflexion views. ?Recommendation:        - Discharge patient to home. ?                       - Resume previous diet. ?                       - Resume antiplatelet medication at prior dose today. ?                       - Await pathology results. ?                       - Repeat colonoscopy is not recommended due to current  ?  age (67 years or older) for surveillance. ?                       - Return to referring physician as previously  ?                       scheduled. ?Procedure Code(s):     --- Professional --- ?                       [phone], Colonoscopy, flexible; with biopsy, single or  ?                       multiple ?Diagnosis Code(s):     --- Professional --- ?                       K63.5, Polyp of colon ?                       K64.0, First degree hemorrhoids ?                       K62.5, Hemorrhage of anus and rectum ?                       R19.4, Change in bowel habit ?                       K57.30, Diverticulosis of large intestine without  ?                       perforation or abscess without bleeding ?CPT copyright 2019 American Medical Association. All rights reserved. ?The codes documented in this report are preliminary and upon coder  review may  ?be revised to meet current compliance requirements. ?Andrey Farmer MD, MD ?10/03/2021 11:23:11 AM ?Number of Addenda: 0 ?Note Initiated On: 10/03/2021 10:45 AM ?Scope Withdrawal Time: 0 hours 6 minutes 19 seconds  ?Total Procedure Duration: 0 hours 15 minutes 32 seconds  ?Estimated Blood Loss:  Estimated blood loss was minimal. ?     [facility] ?

## 2021-10-06 ENCOUNTER — Encounter: Payer: Self-pay | Admitting: Gastroenterology

## 2021-10-06 LAB — SURGICAL PATHOLOGY

## 2021-12-01 NOTE — Addendum Note (Signed)
Encounter addended by: Annie Paras on: 12/01/2021 3:53 PM  Actions taken: Letter saved

## 2022-01-14 ENCOUNTER — Telehealth: Payer: Self-pay

## 2022-01-14 NOTE — Telephone Encounter (Signed)
Follow up scheduled for 01/21/22 @ 11 AM.

## 2022-01-21 ENCOUNTER — Telehealth: Payer: Self-pay | Admitting: Nurse Practitioner

## 2022-01-21 ENCOUNTER — Encounter: Payer: Medicare HMO | Admitting: Nurse Practitioner

## 2022-01-21 NOTE — Progress Notes (Signed)
This encounter was created in error - please disregard.

## 2022-01-21 NOTE — Telephone Encounter (Signed)
I attempted to contact Diana Kent for telemedicine telephonic f/u pc visit, no answer, unable to leave a message, will continue to try to reach

## 2022-02-26 ENCOUNTER — Encounter: Payer: Self-pay | Admitting: Urology

## 2022-02-26 ENCOUNTER — Ambulatory Visit (INDEPENDENT_AMBULATORY_CARE_PROVIDER_SITE_OTHER): Payer: Medicare HMO | Admitting: Urology

## 2022-02-26 VITALS — BP 134/89 | HR 61 | Ht 66.5 in | Wt 172.0 lb

## 2022-02-26 DIAGNOSIS — N189 Chronic kidney disease, unspecified: Secondary | ICD-10-CM | POA: Diagnosis not present

## 2022-02-26 DIAGNOSIS — R3129 Other microscopic hematuria: Secondary | ICD-10-CM

## 2022-02-26 DIAGNOSIS — N281 Cyst of kidney, acquired: Secondary | ICD-10-CM

## 2022-02-26 DIAGNOSIS — N3941 Urge incontinence: Secondary | ICD-10-CM

## 2022-02-26 LAB — URINALYSIS, ROUTINE W REFLEX MICROSCOPIC
Bilirubin, UA: NEGATIVE
Glucose, UA: NEGATIVE
Ketones, UA: NEGATIVE
Leukocytes,UA: NEGATIVE
Nitrite, UA: NEGATIVE
Specific Gravity, UA: 1.015 (ref 1.005–1.030)
Urobilinogen, Ur: 0.2 mg/dL (ref 0.2–1.0)
pH, UA: 6.5 (ref 5.0–7.5)

## 2022-02-26 LAB — MICROSCOPIC EXAMINATION: Renal Epithel, UA: NONE SEEN /hpf

## 2022-02-26 NOTE — Progress Notes (Signed)
Assessment: 1. Microscopic hematuria   2. Renal cyst, bilateral   3. Chronic kidney disease, unspecified CKD stage   4. Urge incontinence     Plan: I reviewed the patient's records from [facility] including office notes and imaging results. I reviewed the prior imaging results including MRI from May 2022 and renal ultrasound from April 2023.  I also reviewed the prior urology notes from 2017. Given her history of possible intermittent gross hematuria, recommend further evaluation with repeat cystoscopy. We will schedule for cystoscopy in the near future. Agree with trial of Myrbetriq 25 mg daily.  Return to office in 3-4 weeks for cystoscopy.  Chief Complaint:  Chief Complaint  Patient presents with   Hematuria    History of Present Illness:  Diana Kent is a 81 y.o. female who is seen in consultation from [facility], DO for evaluation of microscopic hematuria.  She has had recent microscopic hematuria on urinalysis from June 2022 and March 2023.  She reports occasional blood spotting in her underwear.  No dysuria or flank pain.   MRI of the abdomen without contrast from May 2022 showed bilateral renal cyst, no hydronephrosis, stable proteinaceous/hemorrhagic cyst in the right kidney. Renal ultrasound from April 2023 showed small kidneys bilaterally with increased echogenicity suggesting medical renal disease, bilateral renal cysts. She was previously evaluated for gross hematuria by Dr. Tresa Moore in January 2017.  CT imaging at that time showed bilateral renal cyst without enhancement or worrisome features.  Cystoscopy was unremarkable.  She does have urinary frequency, urgency, nocturia 2-3 times, and occasional urge incontinence.  She has been on oxybutynin but was recently changed to Myrbetriq 25 mg in August 2023.  She is not sure if she has started taking this medication yet.  Past Medical History:  Past Medical History:  Diagnosis Date   Acid reflux    Arthritis    Asthma    CKD (chronic kidney disease) stage 3, GFR 30-59 ml/min (HCC) 06/04/2015   Glaucoma    Gross hematuria 06/04/2015   Hyperlipidemia    Hypertension    Renal cysts, acquired, bilateral 06/04/2015   Seizure ([place])    Sleep apnea    Stroke [facility])     Past Surgical History:  Past Surgical History:  Procedure Laterality Date   ABDOMINAL HYSTERECTOMY     COLONOSCOPY WITH PROPOFOL N/A 08/04/2017   Procedure: COLONOSCOPY WITH PROPOFOL;  Surgeon: Manya Silvas, MD;  Location: [facility] ENDOSCOPY;  Service: Endoscopy;  Laterality: N/A;   COLONOSCOPY WITH PROPOFOL N/A 10/03/2021   Procedure: COLONOSCOPY WITH PROPOFOL;  Surgeon: Lesly Rubenstein, MD;  Location: [facility] ENDOSCOPY;  Service: Endoscopy;  Laterality: N/A;   HAND SURGERY     PARTIAL HYSTERECTOMY      Allergies:  Allergies  Allergen Reactions   Latex    Penicillins     Family History:  Family History  Problem Relation Age of Onset   Bone cancer Mother    Hypertension Father    Diabetes Father    CVA Father    Prostate cancer Neg Hx    Kidney cancer Neg Hx     Social History:  Social History   Tobacco Use   Smoking status: Former    Types: Cigarettes    Quit date: 06/03/1990    Years since quitting: 31.7   Smokeless tobacco: Never  Vaping Use   Vaping Use: Never used  Substance Use Topics   Alcohol use: No    Alcohol/week: 0.0 standard drinks of alcohol    Drug use: No    Review of symptoms:  Constitutional:  Negative for unexplained weight loss, night sweats, fever, chills ENT:  Negative for nose bleeds, sinus pain, painful swallowing CV:  Negative for chest pain, shortness of breath, exercise intolerance, palpitations, loss of consciousness Resp:  Negative for cough, wheezing, shortness of breath GI:  Negative for nausea, vomiting, diarrhea, bloody stools GU:  Positives noted in HPI; otherwise negative for gross hematuria, dysuria, urinary incontinence Neuro:  Negative for seizures, poor balance, limb weakness, slurred speech Psych:  Negative for lack of energy, depression, anxiety Endocrine:  Negative for polydipsia, polyuria, symptoms of hypoglycemia (dizziness, hunger, sweating) Hematologic:  Negative for anemia, purpura, petechia, prolonged or excessive bleeding, use of anticoagulants  Allergic:  Negative for difficulty breathing or choking as a result of exposure to anything; no shellfish allergy; no allergic response (rash/itch) to materials, foods  Physical exam: BP 134/89   Pulse 61   Ht 5' 6.5" (1.689 m)   Wt 172 lb (78 kg)   BMI 27.35 kg/m  GENERAL APPEARANCE:  Well appearing, well developed, well nourished, NAD HEENT: Atraumatic, Normocephalic, oropharynx clear. NECK: Supple without lymphadenopathy or thyromegaly. LUNGS: Clear to auscultation bilaterally. HEART: Regular Rate and Rhythm without murmurs, gallops, or rubs. ABDOMEN: Soft, non-tender, No Masses. EXTREMITIES: Moves all extremities well.  Without clubbing, cyanosis, or edema. NEUROLOGIC:  Alert and oriented x 3, normal gait, CN II-XII grossly intact.  MENTAL STATUS:  Appropriate. BACK:  Non-tender to palpation.  No CVAT SKIN:  Warm, dry and intact.    Results: U/A: 0-5 WBCs, 3-10 RBCs, few bacteria

## 2022-03-26 ENCOUNTER — Encounter: Payer: Self-pay | Admitting: Urology

## 2022-03-26 ENCOUNTER — Other Ambulatory Visit: Payer: Medicare HMO | Admitting: Urology

## 2022-03-26 ENCOUNTER — Ambulatory Visit (INDEPENDENT_AMBULATORY_CARE_PROVIDER_SITE_OTHER): Payer: Medicare HMO | Admitting: Urology

## 2022-03-26 VITALS — BP 119/76 | HR 88 | Ht 66.5 in | Wt 172.0 lb

## 2022-03-26 DIAGNOSIS — N281 Cyst of kidney, acquired: Secondary | ICD-10-CM

## 2022-03-26 DIAGNOSIS — N189 Chronic kidney disease, unspecified: Secondary | ICD-10-CM | POA: Diagnosis not present

## 2022-03-26 DIAGNOSIS — N3941 Urge incontinence: Secondary | ICD-10-CM | POA: Diagnosis not present

## 2022-03-26 DIAGNOSIS — R3129 Other microscopic hematuria: Secondary | ICD-10-CM | POA: Diagnosis not present

## 2022-03-26 LAB — URINALYSIS, ROUTINE W REFLEX MICROSCOPIC
Bilirubin, UA: NEGATIVE
Glucose, UA: NEGATIVE
Leukocytes,UA: NEGATIVE
Nitrite, UA: NEGATIVE
Specific Gravity, UA: 1.015 (ref 1.005–1.030)
Urobilinogen, Ur: 0.2 mg/dL (ref 0.2–1.0)
pH, UA: 5.5 (ref 5.0–7.5)

## 2022-03-26 LAB — MICROSCOPIC EXAMINATION

## 2022-03-26 MED ORDER — CIPROFLOXACIN HCL 500 MG PO TABS
500.0000 mg | ORAL_TABLET | Freq: Once | ORAL | Status: AC
  Administered 2022-03-26: 500 mg via ORAL

## 2022-03-26 NOTE — Progress Notes (Signed)
Assessment: 1. Microscopic hematuria   2. Renal cyst, bilateral   3. Chronic kidney disease, unspecified CKD stage   4. Urge incontinence     Plan: Cipro x 1 following cystoscopy Continue Myrbetriq 25 mg daily.  Return to office in 6 months  Chief Complaint:  Chief Complaint  Patient presents with   Cysto    History of Present Illness:  Diana Kent is a 81 y.o. female who is seen for further evaluation of microscopic hematuria.  She has had recent microscopic hematuria on urinalysis from June 2022 and March 2023.  She reports occasional blood spotting in her underwear.  No dysuria or flank pain.   MRI of the abdomen without contrast from May 2022 showed bilateral renal cyst, no hydronephrosis, stable proteinaceous/hemorrhagic cyst in the right kidney. Renal ultrasound from April 2023 showed small kidneys bilaterally with increased echogenicity suggesting medical renal disease, bilateral renal cysts. She was previously evaluated for gross hematuria by Dr. Tresa Moore in January 2017.  CT imaging at that time showed bilateral renal cyst without enhancement or worrisome features.  Cystoscopy was unremarkable.  She reported urinary frequency, urgency, nocturia 2-3 times, and occasional urge incontinence.  She has been on oxybutynin but was recently changed to Myrbetriq 25 mg in August 2023.    She presents today for cystoscopy.  No gross hematuria or dysuria.  She is taking the Myrbetriq 25 mg daily with improvement in her urgency and incontinence.  Portions of the above documentation were copied from a prior visit for review purposes only.  Past Medical History:  Past Medical History:  Diagnosis Date   Acid reflux    Arthritis    Asthma    CKD (chronic kidney disease) stage 3, GFR 30-59 ml/min (HCC) 06/04/2015   Glaucoma    Gross hematuria 06/04/2015   Hyperlipidemia    Hypertension    Renal cysts, acquired, bilateral 06/04/2015   Seizure ([place])    Sleep apnea    Stroke [facility])     Past Surgical History:  Past Surgical History:  Procedure Laterality Date   ABDOMINAL HYSTERECTOMY     COLONOSCOPY WITH PROPOFOL N/A 08/04/2017   Procedure: COLONOSCOPY WITH PROPOFOL;  Surgeon: Manya Silvas, MD;  [facility] ENDOSCOPY;  Service: Endoscopy;  Laterality: N/A;   COLONOSCOPY WITH PROPOFOL N/A 10/03/2021   Procedure: COLONOSCOPY WITH PROPOFOL;  Surgeon: Lesly Rubenstein, MD;  Location: [facility] ENDOSCOPY;  Service: Endoscopy;  Laterality: N/A;   HAND SURGERY     PARTIAL HYSTERECTOMY      Allergies:  Allergies  Allergen Reactions   Latex    Penicillins     Family History:  Family History  Problem Relation Age of Onset   Bone cancer Mother    Hypertension Father    Diabetes Father    CVA Father    Prostate cancer Neg Hx    Kidney cancer Neg Hx     Social History:  Social History   Tobacco Use   Smoking status: Former    Types: Cigarettes    Quit date: 06/03/1990    Years since quitting: 31.8   Smokeless tobacco: Never  Vaping Use   Vaping Use: Never used  Substance Use Topics   Alcohol use: No    Alcohol/week: 0.0 standard drinks of alcohol   Drug use: No    ROS: Constitutional:  Negative for fever, chills, weight loss CV: Negative for chest pain, previous MI, hypertension Respiratory:  Negative for shortness of breath, wheezing, sleep apnea, frequent cough GI:  Negative for nausea, vomiting, bloody  stool, GERD  Physical exam: BP 119/76   Pulse 88   Ht 5' 6.5" (1.689 m)   Wt 172 lb (78 kg)   BMI 27.35 kg/m  GENERAL APPEARANCE:  Well appearing, well developed, well nourished, NAD HEENT:  Atraumatic, normocephalic, oropharynx clear NECK:  Supple without lymphadenopathy or thyromegaly ABDOMEN:  Soft, non-tender, no masses EXTREMITIES:  Moves all extremities well, without clubbing, cyanosis, or edema NEUROLOGIC:  Alert and oriented x 3, normal gait, CN II-XII grossly intact MENTAL STATUS:  appropriate BACK:  Non-tender to palpation, No CVAT SKIN:  Warm, dry, and intact  Results: U/A: 0-5 WBCs, 0-2 RBCs, moderate bacteria, nitrite negative  Procedure:  Flexible Cystourethroscopy  Pre-operative Diagnosis: Microscopic hematuria  Post-operative Diagnosis: Microscopic hematuria  Anesthesia:  local with lidocaine jelly  Surgical Narrative:  After appropriate informed consent was obtained, the patient was prepped and draped in the usual sterile fashion in the supine position.  The patient was correctly identified and the proper procedure delineated prior to proceeding.  Sterile lidocaine gel was instilled in the urethra. The flexible cystoscope was introduced without difficulty.  Findings:  Urethra: Normal  Bladder: Normal  Ureteral orifices: normal  Additional findings: none  Saline bladder wash for cytology was not performed.    The cystoscope was then removed.  The patient tolerated the procedure well.

## 2022-03-26 NOTE — Progress Notes (Deleted)
Assessment: 1. Microscopic hematuria   2. Renal cyst, bilateral   3. Chronic kidney disease, unspecified CKD stage   4. Urge incontinence      Plan: I reviewed the patient's records from [facility] including office notes and imaging results. I reviewed the prior imaging results including MRI from May 2022 and renal ultrasound from April 2023.  I also reviewed the prior urology notes from 2017. Given her history of possible intermittent gross hematuria, recommend further evaluation with repeat cystoscopy. We will schedule for cystoscopy in the near future. Agree with trial of Myrbetriq 25 mg daily.  Return to office in 3-4 weeks for cystoscopy.  Chief Complaint:  No chief complaint on file.   History of Present Illness:  Diana Kent is a 81 y.o. female who is seen for further evaluation of microscopic hematuria.  She has had recent microscopic hematuria on urinalysis from June 2022 and March 2023.  She reports occasional blood spotting in her underwear.  No dysuria or flank pain.   MRI of the abdomen without contrast from May 2022 showed bilateral renal cyst, no hydronephrosis, stable proteinaceous/hemorrhagic cyst in the right kidney. Renal ultrasound from April 2023 showed small kidneys bilaterally with increased echogenicity suggesting medical renal disease, bilateral renal cysts. She was previously evaluated for gross hematuria by Dr. Tresa Moore in January 2017.  CT imaging at that time showed bilateral renal cyst without enhancement or worrisome features.  Cystoscopy was unremarkable.  She does have urinary frequency, urgency, nocturia 2-3 times, and occasional urge incontinence.  She has been on oxybutynin but was recently changed to Myrbetriq 25 mg in August 2023.  She is not sure if she has started taking this medication yet.  She presents today for cystoscopy.  Portions of the above documentation were copied from a prior visit for review purposes only.  Past Medical History:  Past Medical History:  Diagnosis Date   Acid reflux    Arthritis    Asthma    CKD (chronic kidney disease) stage 3, GFR 30-59 ml/min (HCC) 06/04/2015   Glaucoma    Gross hematuria 06/04/2015   Hyperlipidemia    Hypertension    Renal cysts, acquired, bilateral 06/04/2015   Seizure ([place])    Sleep apnea    Stroke [facility])     Past Surgical History:  Past Surgical History:  Procedure Laterality Date   ABDOMINAL HYSTERECTOMY     COLONOSCOPY WITH PROPOFOL N/A 08/04/2017   Procedure: COLONOSCOPY WITH PROPOFOL;  Surgeon: Manya Silvas, MD;  Location: [facility] ENDOSCOPY;  Service: Endoscopy;  Laterality: N/A;   COLONOSCOPY WITH PROPOFOL N/A 10/03/2021   Procedure: COLONOSCOPY WITH PROPOFOL;  Surgeon: Lesly Rubenstein, MD;  Location: [facility] ENDOSCOPY;  Service: Endoscopy;  Laterality: N/A;   HAND SURGERY     PARTIAL HYSTERECTOMY      Allergies:  Allergies  Allergen Reactions   Latex    Penicillins     Family History:  Family History  Problem Relation Age of Onset   Bone cancer Mother    Hypertension Father    Diabetes Father    CVA Father    Prostate cancer Neg Hx    Kidney cancer Neg Hx     Social History:  Social History   Tobacco Use   Smoking status: Former    Types: Cigarettes    Quit date: 06/03/1990    Years since quitting: 31.8   Smokeless tobacco: Never  Vaping Use   Vaping Use: Never used  Substance Use  Topics   Alcohol use: No    Alcohol/week: 0.0 standard drinks of alcohol   Drug use: No    ROS: Constitutional:  Negative for fever, chills, weight loss CV: Negative for chest pain, previous MI, hypertension Respiratory:  Negative for shortness of breath, wheezing, sleep apnea, frequent cough GI:  Negative for nausea, vomiting, bloody stool, GERD  Physical exam: There were no vitals taken for this visit. GENERAL APPEARANCE:  Well appearing, well developed, well nourished, NAD HEENT:  Atraumatic, normocephalic, oropharynx clear NECK:  Supple without lymphadenopathy or thyromegaly ABDOMEN:  Soft, non-tender, no masses EXTREMITIES:  Moves all extremities well, without clubbing, cyanosis, or edema NEUROLOGIC:  Alert and oriented x 3, normal gait, CN II-XII grossly intact MENTAL STATUS:  appropriate BACK:  Non-tender to palpation, No CVAT SKIN:  Warm, dry, and intact  Results: U/A:   Procedure:  Flexible Cystourethroscopy  Pre-operative Diagnosis: {cysto diagnosis:26394}  Post-operative Diagnosis: {cysto diagnosis:26394}  Anesthesia:  local with lidocaine jelly  Surgical Narrative:  After appropriate informed consent was obtained, the patient was prepped and draped in the usual sterile fashion in the supine position.  The patient was correctly identified and the proper procedure delineated prior to proceeding.  Sterile lidocaine gel was instilled in the urethra. The flexible cystoscope was introduced without difficulty.  Findings:  Urethra: {anterior urethral findings:26395}  Bladder: {bladder findings:26397}  Ureteral orifices: {Normal/Abnormal Appearance:21344::"normal"}  Additional findings:  Saline bladder wash for cytology {WAS/WAS NOT:[phone]::"was not"} performed.    The cystoscope was then removed.  The patient tolerated the procedure well.

## 2022-06-12 ENCOUNTER — Other Ambulatory Visit (HOSPITAL_COMMUNITY): Payer: Self-pay | Admitting: Family Medicine

## 2022-06-12 DIAGNOSIS — Z1231 Encounter for screening mammogram for malignant neoplasm of breast: Secondary | ICD-10-CM

## 2022-07-06 ENCOUNTER — Ambulatory Visit (HOSPITAL_COMMUNITY)
Admission: RE | Admit: 2022-07-06 | Discharge: 2022-07-06 | Disposition: A | Discharge status: Home / Self Care | Payer: Medicare HMO | Source: Ambulatory Visit | Attending: Family Medicine | Admitting: Family Medicine

## 2022-07-06 DIAGNOSIS — Z1231 Encounter for screening mammogram for malignant neoplasm of breast: Secondary | ICD-10-CM | POA: Diagnosis not present

## 2022-09-16 ENCOUNTER — Other Ambulatory Visit (HOSPITAL_COMMUNITY): Payer: Self-pay | Admitting: Family Medicine

## 2022-09-16 DIAGNOSIS — Z13820 Encounter for screening for osteoporosis: Secondary | ICD-10-CM

## 2022-09-30 ENCOUNTER — Ambulatory Visit (HOSPITAL_COMMUNITY)
Admission: RE | Admit: 2022-09-30 | Discharge: 2022-09-30 | Disposition: A | Discharge status: Home / Self Care | Payer: Medicare HMO | Source: Ambulatory Visit | Attending: Family Medicine | Admitting: Family Medicine

## 2022-09-30 DIAGNOSIS — Z13820 Encounter for screening for osteoporosis: Secondary | ICD-10-CM | POA: Diagnosis present

## 2022-09-30 DIAGNOSIS — M85852 Other specified disorders of bone density and structure, left thigh: Secondary | ICD-10-CM | POA: Insufficient documentation

## 2022-09-30 DIAGNOSIS — Z780 Asymptomatic menopausal state: Secondary | ICD-10-CM | POA: Diagnosis not present

## 2023-01-21 ENCOUNTER — Other Ambulatory Visit (HOSPITAL_COMMUNITY): Payer: Self-pay | Admitting: Nephrology

## 2023-01-21 DIAGNOSIS — N281 Cyst of kidney, acquired: Secondary | ICD-10-CM

## 2023-01-21 DIAGNOSIS — N189 Chronic kidney disease, unspecified: Secondary | ICD-10-CM

## 2023-02-08 ENCOUNTER — Ambulatory Visit (HOSPITAL_COMMUNITY)
Admission: RE | Admit: 2023-02-08 | Discharge: 2023-02-08 | Disposition: A | Discharge status: Home / Self Care | Payer: Medicare HMO | Source: Ambulatory Visit | Attending: Nephrology | Admitting: Nephrology

## 2023-02-08 DIAGNOSIS — N189 Chronic kidney disease, unspecified: Secondary | ICD-10-CM | POA: Diagnosis not present

## 2023-02-08 DIAGNOSIS — N281 Cyst of kidney, acquired: Secondary | ICD-10-CM | POA: Insufficient documentation

## 2023-09-22 ENCOUNTER — Other Ambulatory Visit (HOSPITAL_COMMUNITY): Payer: Self-pay | Admitting: Family Medicine

## 2023-09-22 DIAGNOSIS — Z1231 Encounter for screening mammogram for malignant neoplasm of breast: Secondary | ICD-10-CM

## 2024-01-05 ENCOUNTER — Other Ambulatory Visit: Payer: Self-pay

## 2024-01-05 ENCOUNTER — Ambulatory Visit: Admitting: Allergy & Immunology

## 2024-01-05 ENCOUNTER — Encounter: Payer: Self-pay | Admitting: Allergy & Immunology

## 2024-01-05 VITALS — BP 104/68 | HR 74 | Temp 97.9°F | Resp 16 | Ht 65.35 in | Wt 169.8 lb

## 2024-01-05 DIAGNOSIS — J3489 Other specified disorders of nose and nasal sinuses: Secondary | ICD-10-CM | POA: Diagnosis not present

## 2024-01-05 DIAGNOSIS — T783XXA Angioneurotic edema, initial encounter: Secondary | ICD-10-CM

## 2024-01-05 DIAGNOSIS — T783XXD Angioneurotic edema, subsequent encounter: Secondary | ICD-10-CM

## 2024-01-05 NOTE — Patient Instructions (Addendum)
 1. Angioedema - I am not sure what caused your swelling, but maybe the labs will answer some of these questions.  - We are going to get some labs to look for weird causes of swelling.hives.  - Start taking cetirizine 10mg  once daily to keep the swelling/rashes at bay.   2. Rhinorrhea - We will get some environmental allergy panel via the blood to see if there is anything that might be causing your runny nose.  - We will see if your dogs are contributing to your symptom.  3. Return in about 3 months (around 04/06/2024). You can have the follow up appointment with Dr. Iva or a Nurse Practicioner (our Nurse Practitioners are excellent and always have Physician oversight!).    Please inform us  of any Emergency Department visits, hospitalizations, or changes in symptoms. Call us  before going to the ED for breathing or allergy symptoms since we might be able to fit you in for a sick visit. Feel free to contact us  anytime with any questions, problems, or concerns.  It was a pleasure to meet you today!  Websites that have reliable patient information: 1. American Academy of Asthma, Allergy, and Immunology: www.aaaai.org 2. Food Allergy Research and Education (FARE): foodallergy.org 3. Mothers of Asthmatics: http://www.asthmacommunitynetwork.org 4. American College of Allergy, Asthma, and Immunology: www.acaai.org      "Like" us  on Facebook and Instagram for our latest updates!      A healthy democracy works best when [employer] participate! Make sure you are registered to vote! If you have moved or changed any of your contact information, you will need to get this updated before voting! Scan the QR codes below to learn more!

## 2024-01-05 NOTE — Progress Notes (Signed)
 NEW PATIENT  Date of Service/Encounter:  01/05/24  Consult requested by: Janace Stephens RAMAN, MD   Assessment:   Angioedema  Rhinorrhea - getting environmental allergy testing via the blood  Plan/Recommendations:   1. Angioedema - I am not sure what caused your swelling, but maybe the labs will answer some of these questions.  - We are going to get some labs to look for weird causes of swelling.hives.  - Start taking cetirizine 10mg  once daily to keep the swelling/rashes at bay.   2. Rhinorrhea - We will get some environmental allergy panel via the blood to see if there is anything that might be causing your runny nose.  - We will see if your dogs are contributing to your symptom.  3. Return in about 3 months (around 04/06/2024). You can have the follow up appointment with Dr. Iva or a Nurse Practicioner (our Nurse Practitioners are excellent and always have Physician oversight!).   This note in its entirety was forwarded to the Provider who requested this consultation.  Subjective:   Diana Kent is a 83 y.o. female presenting today for evaluation of  Chief Complaint  Patient presents with   Establish Care    Anaphylactic shock, started off with hives, swelling to unknown reason.    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  Discussed the use of AI scribe software for clinical note transcription with the patient and/or guardian, who gave verbal consent to proceed.  Eshika Reckart was referred by Zhou-Talbert, Stephens RAMAN, MD.     Makana is a 83 y.o. female presenting for an evaluation of angioedema.  Over a month ago, she experienced a sudden onset of swelling and hives. She received two injections,  one in each arm, and another in her hip, but she does not recall the specific medications. She received an EpiPen, but is unclear about its duration or use, and has not had another episode since.  She does not recall any specific dietary changes or new medications at the time of the episode. Her son suggested that the reaction might have been related to paint used in the bathroom, but she is uncertain. She has not experienced similar episodes since then.  She has a history of low iron levels, for which she took an iron supplement. However, she discontinued it due to diarrhea. Upon resuming the supplement, she did not experience any swelling.  No history of allergies or asthma in childhood or adulthood. She reports having a runny nose for the past year, which she attributes to nasal blockages.  Her feet continue to swell slightly, but she is unsure of the cause. She is on multiple medications, including [place], but is unable to specify the purpose of each medication.  She lives with her son and previously worked on a farm and in Press photographer work. She sustained a crushed hand injury in 1981, which has affected her ability to use her right hand.    Otherwise, there is no history of other atopic diseases, including asthma, food allergies, drug allergies, environmental allergies, or contact dermatitis. There is no significant infectious history. Vaccinations are up to date.    Past Medical History: Patient Active Problem List   Diagnosis Date Noted   Renal  cysts, acquired, bilateral 06/04/2015   CKD (chronic kidney disease) stage 3, GFR 30-59 ml/min (HCC) 06/04/2015   Gross hematuria 06/04/2015    Medication List:  Allergies as of 01/05/2024       Reactions   Latex    Penicillins         Medication List        Accurate as of January 05, 2024  4:15 PM. If you have any questions, ask your nurse or doctor.          STOP taking these medications    metoprolol succinate 100 MG 24 hr  tablet Commonly known as: TOPROL-XL Stopped by: Marty Morton Shaggy       TAKE these medications    acetaminophen 500 MG tablet Commonly known as: TYLENOL Take 500 mg by mouth every 6 (six) hours as needed.   amLODipine 5 MG tablet Commonly known as: NORVASC Take 5 mg by mouth daily.   calcium citrate 950 (200 Ca) MG tablet Commonly known as: CALCITRATE - dosed in mg elemental calcium Take 200 mg of elemental calcium by mouth daily.   dipyridamole-aspirin 200-25 MG 12hr capsule Commonly known as: AGGRENOX   donepezil 10 MG tablet Commonly known as: ARICEPT   DULoxetine 60 MG capsule Commonly known as: CYMBALTA Take 60 mg by mouth daily.   EpiPen 2-Pak 0.3 MG/0.3ML Soaj injection Generic drug: EPINEPHrine Inject 0.3 mg into the muscle as needed.   FeroSul 325 (65 Fe) MG tablet Generic drug: ferrous sulfate Take 325 mg by mouth daily.   lamoTRIgine 100 MG tablet Commonly known as: LAMICTAL Take 100 mg by mouth 2 (two) times daily.   levothyroxine 88 MCG tablet Commonly known as: SYNTHROID Take 88 mcg by mouth daily before breakfast. What changed: Another medication with the same name was removed. Continue taking this medication, and follow the directions you see here. Changed by: Marty Morton Shaggy   loratadine 10 MG tablet Commonly known as: CLARITIN Take 10 mg by mouth daily.   losartan 50 MG tablet Commonly known as: COZAAR   lovastatin 10 MG tablet Commonly known as: MEVACOR Take 10 mg by mouth at bedtime. What changed: Another medication with the same name was removed. Continue taking this medication, and follow the directions you see here. Changed by: Marty Morton Shaggy   metoprolol tartrate 50 MG tablet Commonly known as: LOPRESSOR Take 50 mg by mouth 2 (two) times daily.   Myrbetriq 50 MG Tb24 tablet Generic drug: mirabegron ER Take 50 mg by mouth daily.   potassium chloride SA 20 MEQ tablet Commonly known as: KLOR-CON M Take 20 mEq  by mouth daily.        Birth History: non-contributory  Developmental History: non-contributory  Past Surgical History: Past Surgical History:  Procedure Laterality Date   ABDOMINAL HYSTERECTOMY     COLONOSCOPY WITH PROPOFOL  N/A 08/04/2017   Procedure: COLONOSCOPY WITH PROPOFOL ;  Surgeon: Viktoria Lamar DASEN, MD;  [facility] ENDOSCOPY;  Service: Endoscopy;  Laterality: N/A;   COLONOSCOPY WITH PROPOFOL  N/A 10/03/2021   Procedure: COLONOSCOPY WITH PROPOFOL ;  Surgeon: Maryruth Ole DASEN, MD;  Location: [facility] ENDOSCOPY;  Service: Endoscopy;  Laterality: N/A;   HAND SURGERY     PARTIAL HYSTERECTOMY       Family History: Family History  Problem Relation Age of Onset   Bone cancer Mother    Hypertension Father    Diabetes Father    CVA Father    Prostate cancer Neg Hx    Kidney cancer  Neg Hx      Social History: Jerzy lives at home with her son.  She lives in a house that is 16 years old.  There is carpeting in the main living areas and hardwood in the bedroom.  They have avoided oil for their dog.  There are no vesicles of the bedding.  There is record exposure in the house, [place].  There are no fumes, chemicals, or dust pressures.   Review of systems otherwise negative other than that mentioned in the HPI.    Objective:   Blood pressure 104/68, pulse 74, temperature 97.9 F (36.6 C), temperature source Temporal, resp. rate 16, height 5' 5.35 (1.66 m), weight 169 lb 12.8 oz (77 kg), SpO2 95%. Body mass index is 27.95 kg/m.     Physical Exam Vitals reviewed.  Constitutional:      Appearance: She is well-developed.  HENT:     Head: Normocephalic and atraumatic.     Right Ear: Tympanic membrane, ear canal and external ear normal. No drainage, swelling or tenderness. Tympanic membrane is not injected, scarred, erythematous, retracted or bulging.     Left Ear: Tympanic membrane, ear canal and external ear normal. No drainage, swelling or tenderness. Tympanic  membrane is not injected, scarred, erythematous, retracted or bulging.     Nose: No nasal deformity, septal deviation, mucosal edema or rhinorrhea.     Right Turbinates: Swollen and pale.     Left Turbinates: Swollen and pale.     Right Sinus: No maxillary sinus tenderness or frontal sinus tenderness.     Left Sinus: No maxillary sinus tenderness or frontal sinus tenderness.     Mouth/Throat:     Mouth: Mucous membranes are not pale and not dry.     Pharynx: Uvula midline.  Eyes:     General: Allergic shiner present.        Right eye: No discharge.        Left eye: No discharge.     Conjunctiva/sclera: Conjunctivae normal.     Right eye: Right conjunctiva is not injected. No chemosis.    Left eye: Left conjunctiva is not injected. No chemosis.    Pupils: Pupils are equal, round, and reactive to light.  Cardiovascular:     Rate and Rhythm: Normal rate and regular rhythm.     Heart sounds: Normal heart sounds.  Pulmonary:     Effort: Pulmonary effort is normal. No tachypnea, accessory muscle usage or respiratory distress.     Breath sounds: Normal breath sounds. No decreased breath sounds, wheezing, rhonchi or rales.  Chest:     Chest wall: No tenderness.  Abdominal:     Tenderness: There is no abdominal tenderness. There is no guarding or rebound.  Lymphadenopathy:     Head:     Right side of head: No submandibular, tonsillar or occipital adenopathy.     Left side of head: No submandibular, tonsillar or occipital adenopathy.     Cervical: No cervical adenopathy.  Skin:    Coloration: Skin is not pale.     Findings: No abrasion, erythema, petechiae or rash. Rash is not papular, urticarial or vesicular.  Neurological:     Mental Status: She is alert.  Psychiatric:        Behavior: Behavior is cooperative.      Diagnostic studies:  labs sent instead       Marty Shaggy, MD [facility] 

## 2024-04-14 ENCOUNTER — Ambulatory Visit: Admitting: Allergy & Immunology

## 2024-05-05 ENCOUNTER — Ambulatory Visit: Admitting: Family Medicine

## 2024-05-16 ENCOUNTER — Other Ambulatory Visit (HOSPITAL_COMMUNITY): Payer: Self-pay | Admitting: Internal Medicine

## 2024-05-16 DIAGNOSIS — Z1231 Encounter for screening mammogram for malignant neoplasm of breast: Secondary | ICD-10-CM

## 2024-07-21 ENCOUNTER — Ambulatory Visit (HOSPITAL_COMMUNITY)
